# Patient Record
Sex: Male | Born: 1945 | Race: White | Hispanic: No | Smoking: Former smoker
Health system: Southern US, Community
[De-identification: ages and names within clinical notes are randomized; demographics above are authoritative.]

## PROBLEM LIST (undated history)

## (undated) DIAGNOSIS — J449 Chronic obstructive pulmonary disease, unspecified: Secondary | ICD-10-CM

## (undated) DIAGNOSIS — F32A Depression, unspecified: Secondary | ICD-10-CM

## (undated) DIAGNOSIS — F419 Anxiety disorder, unspecified: Secondary | ICD-10-CM

## (undated) DIAGNOSIS — I10 Essential (primary) hypertension: Secondary | ICD-10-CM

## (undated) DIAGNOSIS — E119 Type 2 diabetes mellitus without complications: Secondary | ICD-10-CM

## (undated) DIAGNOSIS — F0390 Unspecified dementia without behavioral disturbance: Secondary | ICD-10-CM

## (undated) DIAGNOSIS — F329 Major depressive disorder, single episode, unspecified: Secondary | ICD-10-CM

---

## 2016-10-27 ENCOUNTER — Emergency Department (HOSPITAL_COMMUNITY): Payer: Medicare Other

## 2016-10-27 ENCOUNTER — Encounter (HOSPITAL_COMMUNITY): Payer: Self-pay | Admitting: Emergency Medicine

## 2016-10-27 ENCOUNTER — Observation Stay (HOSPITAL_COMMUNITY)
Admission: EM | Admit: 2016-10-27 | Discharge: 2016-10-28 | Disposition: A | Discharge status: Skilled Nursing Facility | Payer: Medicare Other | Attending: Internal Medicine | Admitting: Internal Medicine

## 2016-10-27 DIAGNOSIS — M419 Scoliosis, unspecified: Secondary | ICD-10-CM | POA: Diagnosis not present

## 2016-10-27 DIAGNOSIS — R262 Difficulty in walking, not elsewhere classified: Secondary | ICD-10-CM | POA: Insufficient documentation

## 2016-10-27 DIAGNOSIS — R404 Transient alteration of awareness: Secondary | ICD-10-CM | POA: Diagnosis present

## 2016-10-27 DIAGNOSIS — M549 Dorsalgia, unspecified: Secondary | ICD-10-CM | POA: Diagnosis not present

## 2016-10-27 DIAGNOSIS — E1165 Type 2 diabetes mellitus with hyperglycemia: Secondary | ICD-10-CM | POA: Insufficient documentation

## 2016-10-27 DIAGNOSIS — I700 Atherosclerosis of aorta: Secondary | ICD-10-CM | POA: Diagnosis not present

## 2016-10-27 DIAGNOSIS — F419 Anxiety disorder, unspecified: Secondary | ICD-10-CM | POA: Insufficient documentation

## 2016-10-27 DIAGNOSIS — G8929 Other chronic pain: Secondary | ICD-10-CM | POA: Insufficient documentation

## 2016-10-27 DIAGNOSIS — W19XXXA Unspecified fall, initial encounter: Secondary | ICD-10-CM | POA: Diagnosis not present

## 2016-10-27 DIAGNOSIS — F319 Bipolar disorder, unspecified: Secondary | ICD-10-CM | POA: Diagnosis not present

## 2016-10-27 DIAGNOSIS — A419 Sepsis, unspecified organism: Secondary | ICD-10-CM | POA: Diagnosis present

## 2016-10-27 DIAGNOSIS — Z794 Long term (current) use of insulin: Secondary | ICD-10-CM | POA: Diagnosis not present

## 2016-10-27 DIAGNOSIS — E872 Acidosis, unspecified: Secondary | ICD-10-CM | POA: Diagnosis present

## 2016-10-27 DIAGNOSIS — E119 Type 2 diabetes mellitus without complications: Secondary | ICD-10-CM

## 2016-10-27 DIAGNOSIS — F0390 Unspecified dementia without behavioral disturbance: Secondary | ICD-10-CM | POA: Insufficient documentation

## 2016-10-27 DIAGNOSIS — J449 Chronic obstructive pulmonary disease, unspecified: Secondary | ICD-10-CM | POA: Diagnosis not present

## 2016-10-27 DIAGNOSIS — I10 Essential (primary) hypertension: Secondary | ICD-10-CM | POA: Diagnosis not present

## 2016-10-27 DIAGNOSIS — Z79899 Other long term (current) drug therapy: Secondary | ICD-10-CM | POA: Diagnosis not present

## 2016-10-27 HISTORY — DX: Unspecified dementia, unspecified severity, without behavioral disturbance, psychotic disturbance, mood disturbance, and anxiety: F03.90

## 2016-10-27 HISTORY — DX: Chronic obstructive pulmonary disease, unspecified: J44.9

## 2016-10-27 HISTORY — DX: Depression, unspecified: F32.A

## 2016-10-27 HISTORY — DX: Essential (primary) hypertension: I10

## 2016-10-27 HISTORY — DX: Major depressive disorder, single episode, unspecified: F32.9

## 2016-10-27 HISTORY — DX: Type 2 diabetes mellitus without complications: E11.9

## 2016-10-27 HISTORY — DX: Anxiety disorder, unspecified: F41.9

## 2016-10-27 LAB — COMPREHENSIVE METABOLIC PANEL
ALBUMIN: 4.3 g/dL (ref 3.5–5.0)
ALT: 24 U/L (ref 17–63)
ANION GAP: 10 (ref 5–15)
AST: 29 U/L (ref 15–41)
Alkaline Phosphatase: 91 U/L (ref 38–126)
BUN: 9 mg/dL (ref 6–20)
CALCIUM: 9.7 mg/dL (ref 8.9–10.3)
CO2: 25 mmol/L (ref 22–32)
Chloride: 103 mmol/L (ref 101–111)
Creatinine, Ser: 0.72 mg/dL (ref 0.61–1.24)
GFR calc non Af Amer: >60 mL/min (ref >60)
Glucose, Bld: 260 mg/dL — ABNORMAL HIGH (ref 65–99)
POTASSIUM: 4.1 mmol/L (ref 3.5–5.1)
Sodium: 138 mmol/L (ref 135–145)
TOTAL PROTEIN: 7 g/dL (ref 6.5–8.1)
Total Bilirubin: 0.7 mg/dL (ref 0.3–1.2)

## 2016-10-27 LAB — BLOOD GAS, ARTERIAL
Acid-base deficit: 1.7 mmol/L (ref 0.0–2.0)
Bicarbonate: 21.8 mmol/L (ref 20.0–28.0)
DRAWN BY: 103701
FIO2: 21
O2 Saturation: 91.9 %
PCO2 ART: 34.4 mmHg (ref 32.0–48.0)
PH ART: 7.417 (ref 7.350–7.450)
Patient temperature: 98.6
pO2, Arterial: 66.5 mmHg — ABNORMAL LOW (ref 83.0–108.0)

## 2016-10-27 LAB — CBC WITH DIFFERENTIAL/PLATELET
BASOS ABS: 0 10*3/uL (ref 0.0–0.1)
BASOS PCT: 0 %
Eosinophils Absolute: 0.1 10*3/uL (ref 0.0–0.7)
Eosinophils Relative: 2 %
HEMATOCRIT: 37.5 % — AB (ref 39.0–52.0)
HEMOGLOBIN: 12.7 g/dL — AB (ref 13.0–17.0)
LYMPHS PCT: 12 %
Lymphs Abs: 0.7 10*3/uL (ref 0.7–4.0)
MCH: 28.7 pg (ref 26.0–34.0)
MCHC: 33.9 g/dL (ref 30.0–36.0)
MCV: 84.7 fL (ref 78.0–100.0)
Monocytes Absolute: 0.4 10*3/uL (ref 0.1–1.0)
Monocytes Relative: 6 %
NEUTROS ABS: 4.6 10*3/uL (ref 1.7–7.7)
NEUTROS PCT: 80 %
Platelets: 92 10*3/uL — ABNORMAL LOW (ref 150–400)
RBC: 4.43 MIL/uL (ref 4.22–5.81)
RDW: 17.1 % — AB (ref 11.5–15.5)
WBC: 5.8 10*3/uL (ref 4.0–10.5)

## 2016-10-27 LAB — URINALYSIS, ROUTINE W REFLEX MICROSCOPIC
BACTERIA UA: NONE SEEN
BILIRUBIN URINE: NEGATIVE
Glucose, UA: >=500 mg/dL — AB
Hgb urine dipstick: NEGATIVE
KETONES UR: NEGATIVE mg/dL
LEUKOCYTES UA: NEGATIVE
NITRITE: NEGATIVE
PH: 7 (ref 5.0–8.0)
PROTEIN: NEGATIVE mg/dL
Specific Gravity, Urine: 1.011 (ref 1.005–1.030)

## 2016-10-27 LAB — I-STAT CG4 LACTIC ACID, ED
LACTIC ACID, VENOUS: 2.06 mmol/L — AB (ref 0.5–1.9)
Lactic Acid, Venous: 1.83 mmol/L (ref 0.5–1.9)

## 2016-10-27 LAB — MRSA PCR SCREENING: MRSA BY PCR: NEGATIVE

## 2016-10-27 LAB — PROCALCITONIN: Procalcitonin: <0.1 ng/mL

## 2016-10-27 LAB — GLUCOSE, CAPILLARY: Glucose-Capillary: 286 mg/dL — ABNORMAL HIGH (ref 65–99)

## 2016-10-27 LAB — TROPONIN I: Troponin I: 0.04 ng/mL (ref <0.03)

## 2016-10-27 MED ORDER — ONDANSETRON HCL 4 MG PO TABS: 4.0000 mg | ORAL_TABLET | Freq: Four times a day (QID) | ORAL | Status: DC | PRN

## 2016-10-27 MED ORDER — PIPERACILLIN-TAZOBACTAM 3.375 G IVPB 30 MIN
3.3750 g | Freq: Once | INTRAVENOUS | Status: AC
  Administered 2016-10-27: 3.375 g via INTRAVENOUS
  Filled 2016-10-27: qty 50

## 2016-10-27 MED ORDER — ACETAMINOPHEN 325 MG PO TABS: 650.0000 mg | ORAL_TABLET | Freq: Four times a day (QID) | ORAL | Status: DC | PRN

## 2016-10-27 MED ORDER — ACETAMINOPHEN 650 MG RE SUPP: 650.0000 mg | Freq: Four times a day (QID) | RECTAL | Status: DC | PRN

## 2016-10-27 MED ORDER — ALPRAZOLAM 1 MG PO TABS
1.0000 mg | ORAL_TABLET | Freq: Every day | ORAL | Status: DC
  Administered 2016-10-27: 1 mg via ORAL
  Filled 2016-10-27: qty 1

## 2016-10-27 MED ORDER — AMLODIPINE BESYLATE 10 MG PO TABS
10.0000 mg | ORAL_TABLET | Freq: Every day | ORAL | Status: DC
Start: 2016-10-28 — End: 2016-10-28
  Administered 2016-10-28: 10 mg via ORAL
  Filled 2016-10-27: qty 1

## 2016-10-27 MED ORDER — PREGABALIN 25 MG PO CAPS
25.0000 mg | ORAL_CAPSULE | Freq: Two times a day (BID) | ORAL | Status: DC
  Administered 2016-10-27 – 2016-10-28 (×2): 25 mg via ORAL
  Filled 2016-10-27 (×2): qty 1

## 2016-10-27 MED ORDER — ALBUTEROL SULFATE (2.5 MG/3ML) 0.083% IN NEBU: 3.0000 mL | INHALATION_SOLUTION | Freq: Four times a day (QID) | RESPIRATORY_TRACT | Status: DC | PRN

## 2016-10-27 MED ORDER — SIMETHICONE 80 MG PO CHEW: 80.0000 mg | CHEWABLE_TABLET | Freq: Four times a day (QID) | ORAL | Status: DC | PRN

## 2016-10-27 MED ORDER — NICOTINE 7 MG/24HR TD PT24
7.0000 mg | MEDICATED_PATCH | Freq: Every day | TRANSDERMAL | Status: DC
  Filled 2016-10-27: qty 1

## 2016-10-27 MED ORDER — SODIUM CHLORIDE 0.9 % IV BOLUS (SEPSIS)
1000.0000 mL | Freq: Once | INTRAVENOUS | Status: AC
  Administered 2016-10-27: 1000 mL via INTRAVENOUS

## 2016-10-27 MED ORDER — MOMETASONE FURO-FORMOTEROL FUM 100-5 MCG/ACT IN AERO
2.0000 | INHALATION_SPRAY | Freq: Two times a day (BID) | RESPIRATORY_TRACT | Status: DC
  Administered 2016-10-27 – 2016-10-28 (×2): 2 via RESPIRATORY_TRACT
  Filled 2016-10-27: qty 8.8

## 2016-10-27 MED ORDER — ALUM & MAG HYDROXIDE-SIMETH 200-200-20 MG/5ML PO SUSP: 30.0000 mL | Freq: Four times a day (QID) | ORAL | Status: DC | PRN

## 2016-10-27 MED ORDER — TIOTROPIUM BROMIDE MONOHYDRATE 18 MCG IN CAPS
18.0000 ug | ORAL_CAPSULE | Freq: Every day | RESPIRATORY_TRACT | Status: DC
  Administered 2016-10-28: 18 ug via RESPIRATORY_TRACT
  Filled 2016-10-27: qty 5

## 2016-10-27 MED ORDER — VANCOMYCIN HCL IN DEXTROSE 1-5 GM/200ML-% IV SOLN
1000.0000 mg | Freq: Once | INTRAVENOUS | Status: AC
  Administered 2016-10-27: 1000 mg via INTRAVENOUS
  Filled 2016-10-27: qty 200

## 2016-10-27 MED ORDER — BENZTROPINE MESYLATE 0.5 MG PO TABS
1.0000 mg | ORAL_TABLET | Freq: Two times a day (BID) | ORAL | Status: DC
  Administered 2016-10-27 – 2016-10-28 (×2): 1 mg via ORAL
  Filled 2016-10-27 (×2): qty 2

## 2016-10-27 MED ORDER — HYDROXYZINE HCL 50 MG PO TABS: 50.0000 mg | ORAL_TABLET | ORAL | Status: DC | PRN

## 2016-10-27 MED ORDER — ALPRAZOLAM 0.5 MG PO TABS
0.5000 mg | ORAL_TABLET | Freq: Every morning | ORAL | Status: DC
  Administered 2016-10-28: 0.5 mg via ORAL
  Filled 2016-10-27: qty 1

## 2016-10-27 MED ORDER — TRAZODONE HCL 50 MG PO TABS
150.0000 mg | ORAL_TABLET | Freq: Every day | ORAL | Status: DC
Start: 2016-10-27 — End: 2016-10-28
  Administered 2016-10-27: 150 mg via ORAL
  Filled 2016-10-27: qty 3

## 2016-10-27 MED ORDER — ONDANSETRON HCL 4 MG/2ML IJ SOLN: 4.0000 mg | Freq: Four times a day (QID) | INTRAMUSCULAR | Status: DC | PRN

## 2016-10-27 MED ORDER — OLANZAPINE 5 MG PO TABS
10.0000 mg | ORAL_TABLET | Freq: Two times a day (BID) | ORAL | Status: DC
  Administered 2016-10-27 – 2016-10-28 (×2): 10 mg via ORAL
  Filled 2016-10-27 (×2): qty 2

## 2016-10-27 MED ORDER — INSULIN ASPART 100 UNIT/ML ~~LOC~~ SOLN
0.0000 [IU] | Freq: Three times a day (TID) | SUBCUTANEOUS | Status: DC
  Administered 2016-10-28 (×2): 7 [IU] via SUBCUTANEOUS

## 2016-10-27 MED ORDER — OXYCODONE HCL 5 MG PO TABS
5.0000 mg | ORAL_TABLET | Freq: Three times a day (TID) | ORAL | Status: DC | PRN
  Administered 2016-10-27 – 2016-10-28 (×2): 5 mg via ORAL
  Filled 2016-10-27 (×3): qty 1

## 2016-10-27 MED ORDER — ENOXAPARIN SODIUM 40 MG/0.4ML ~~LOC~~ SOLN
40.0000 mg | SUBCUTANEOUS | Status: DC
  Administered 2016-10-27: 40 mg via SUBCUTANEOUS
  Filled 2016-10-27: qty 0.4

## 2016-10-27 MED ORDER — SODIUM CHLORIDE 0.9 % IV SOLN
INTRAVENOUS | Status: DC
  Administered 2016-10-28: 04:00:00 via INTRAVENOUS

## 2016-10-27 MED ORDER — METOPROLOL TARTRATE 25 MG PO TABS
25.0000 mg | ORAL_TABLET | Freq: Two times a day (BID) | ORAL | Status: DC
  Administered 2016-10-27 – 2016-10-28 (×2): 25 mg via ORAL
  Filled 2016-10-27 (×2): qty 1

## 2016-10-27 MED ORDER — SODIUM CHLORIDE 0.9 % IV SOLN
1000.0000 mL | INTRAVENOUS | Status: DC
  Administered 2016-10-27 (×2): 1000 mL via INTRAVENOUS

## 2016-10-27 MED ORDER — SODIUM CHLORIDE 0.9 % IV BOLUS (SEPSIS)
500.0000 mL | Freq: Once | INTRAVENOUS | Status: AC
  Administered 2016-10-27: 500 mL via INTRAVENOUS

## 2016-10-27 NOTE — ED Provider Notes (Signed)
[facility] DEPT Provider Note   CSN: [id number] Arrival date & time: 10/27/16  1131     History   Chief Complaint No chief complaint on file.   HPI Andrew Ward is a 71 y.o. male.  HPI  71 year old male came via EMS from [facility] facility for evaluation of generalized weakness, fever and fall. Hx obtain through the facility nurse, Marvell Fuller, who call EMS initially.  Per nurse, pt was last seen normal by her 2 days ago when she was on her shift, she did not work yesterday.  Today pt was found on the floor in his room, with an apparent unwitnessed fall. He was found confused, had fever.  He normally is independent and active.  Pt has chronic back pain, which he is cared for by a pain specialist, pt does c/o low back pain.  No report of cough, urinary sxs or other infectious sxs.  No prior hx of stroke. Nurse did mentioned that pt was placed on a new medication, Glyburide several days ago.   Hx limited due to AMS.    No past medical history on file.  There are no active problems to display for this patient.   No past surgical history on file.     Home Medications    Prior to Admission medications   Not on File    Family History No family history on file.  Social History Social History  Substance Use Topics  . Smoking status: Not on file  . Smokeless tobacco: Not on file  . Alcohol use Not on file     Allergies   Patient has no allergy information on record.   Review of Systems Review of Systems  Unable to perform ROS: Mental status change     Physical Exam Updated Vital Signs BP 129/71   Pulse 79   Temp 100.2 F (37.9 C) (Rectal)   Resp (!) 30   Wt 75.9 kg (167 lb 4 oz)   SpO2 100%   Physical Exam  Constitutional: He appears well-developed and well-nourished. No distress.  HENT:  Head: Normocephalic and atraumatic.  Mouth is dry  Eyes: Pupils are equal, round, and reactive to light. EOM are normal.  Neck:  Neck is supple,  C-collar in place.  No cervical spine point tenderness, crepitus of step off.  Cardiovascular: Normal rate and regular rhythm.   Pulmonary/Chest: Effort normal and breath sounds normal. He has no wheezes. He has no rales.  Abdominal: Soft. He exhibits no distension. There is no tenderness.  Musculoskeletal: He exhibits tenderness (tenderness along lower spine ).  Moving all 4 extremities  Neurological: He has normal strength. No cranial nerve deficit or sensory deficit. GCS eye subscore is 4. GCS verbal subscore is 4. GCS motor subscore is 6.  Poor finger to nose and heel to shin.  No pronator drift.  Gait not tested.  Cranial nerve III-VII are grossly intact.  NO facial droop.  Nursing note and vitals reviewed.    ED Treatments / Results  Labs (all labs ordered are listed, but only abnormal results are displayed) Labs Reviewed  COMPREHENSIVE METABOLIC PANEL - Abnormal; Notable for the following:       Result Value   Glucose, Bld 260 (*)    All other components within normal limits  CBC WITH DIFFERENTIAL/PLATELET - Abnormal; Notable for the following:    Hemoglobin 12.7 (*)    HCT 37.5 (*)    RDW 17.1 (*)    Platelets 92 (*)  All other components within normal limits  TROPONIN I - Abnormal; Notable for the following:    Troponin I 0.04 (*)    All other components within normal limits  I-STAT CG4 LACTIC ACID, ED - Abnormal; Notable for the following:    Lactic Acid, Venous 2.06 (*)    All other components within normal limits  CULTURE, BLOOD (ROUTINE X 2)  CULTURE, BLOOD (ROUTINE X 2)  URINALYSIS, ROUTINE W REFLEX MICROSCOPIC  I-STAT CG4 LACTIC ACID, ED    EKG  EKG Interpretation None     ED ECG REPORT   Date: 10/27/2016  Rate: 81  Rhythm: normal sinus rhythm  QRS Axis: normal  Intervals: normal  ST/T Wave abnormalities: RSR' in V1 or V2, right VCD or RVH  Conduction Disutrbances:none  Narrative Interpretation:   Old EKG Reviewed: unchanged  I have personally  reviewed the EKG tracing and agree with the computerized printout as noted.   Radiology Dg Chest 2 View  Result Date: 10/27/2016 CLINICAL DATA:  Fall.  Fever. EXAM: CHEST  2 VIEW COMPARISON:  August 15, 2016 FINDINGS: There is no edema or consolidation. The heart size and pulmonary vascularity are normal. No adenopathy. There is aortic atherosclerosis. No evident pneumothorax. No bone lesion evident beyond degenerative change in the thoracic spine. IMPRESSION: No edema or consolidation.  There is aortic atherosclerosis. Aortic Atherosclerosis (ICD10-I70.0). Electronically Signed   By: Lowella Grip III M.D.   On: 10/27/2016 12:56   Dg Lumbar Spine Complete  Result Date: 10/27/2016 CLINICAL DATA:  Pain following fall EXAM: LUMBAR SPINE - COMPLETE 4+ VIEW COMPARISON:  None. FINDINGS: Frontal, lateral, spot lumbosacral lateral, and bilateral oblique views were obtained. There are 5 non-rib-bearing lumbar type vertebral bodies. There is mid lumbar levoscoliosis with rotatory component. There is no demonstrable fracture or spondylolisthesis. There is marked disc space narrowing with vacuum phenomenon at L1-2 and L3-4 bilaterally. There is mild to moderate disc space narrowing elsewhere. There is facet osteoarthritic change at all levels bilaterally. There is aortoiliac atherosclerosis. IMPRESSION: Scoliosis. Multilevel arthropathy, most marked at L1-2 and L3-4. No acute fracture or spondylolisthesis. There is aortoiliac atherosclerosis. Aortic Atherosclerosis (ICD10-I70.0). Electronically Signed   By: Lowella Grip III M.D.   On: 10/27/2016 12:58   Ct Head Wo Contrast  Result Date: 10/27/2016 CLINICAL DATA:  Pain following fall EXAM: CT HEAD WITHOUT CONTRAST CT CERVICAL SPINE WITHOUT CONTRAST TECHNIQUE: Multidetector CT imaging of the head and cervical spine was performed following the standard protocol without intravenous contrast. Multiplanar CT image reconstructions of the cervical spine were also  generated. COMPARISON:  Head CT August 18, 2016 ; cervical MRI March 15, 2016 FINDINGS: CT HEAD FINDINGS Brain: Mild to moderate diffuse atrophy is stable. There is no intracranial mass, hemorrhage, extra-axial fluid collection, or midline shift. There is slight small vessel disease in the centra semiovale bilaterally. Elsewhere gray-white compartments appear normal. No acute infarct evident. Vascular: There is no appreciable hyperdense vessel. Calcification is noted in each carotid siphon and distal internal carotid artery region. Skull: Bony calvarium appears intact. Sinuses/Orbits: There is mucosal thickening in several ethmoid air cells bilaterally. Other paranasal sinuses are clear. Orbits appear symmetric bilaterally. Other: There is opacification in several mastoid air cells on the right, stable. Mastoid air cells on the left are clear. CT CERVICAL SPINE FINDINGS Alignment: There is mid cervical dextroscoliosis. There is 2 mm of retrolisthesis of C6 on C7. There is 3 mm of anterolisthesis of C7 on T1. Skull base and vertebrae: There is  moderate pannus posterior to the odontoid, not causing appreciable impression on the craniocervical junction. Skull base and craniocervical junction regions otherwise appear unremarkable. There is bony remodeling at the superior aspect of C6 and inferior aspect of C5, stable from prior MRI, likely due to residual from old trauma. There is no acute fracture. There are no blastic or lytic bone lesions. Bones appear osteoporotic. Soft tissues and spinal canal: The prevertebral soft tissues and predental space regions are normal. The disc spaces appear unremarkable. Disc levels: There is ankylosis with remodeling at C5-6. There is marked disc space narrowing at C6-7 and C7-T1. There is multilevel facet osteoarthritic change. There is exit foraminal narrowing due to bony hypertrophy at C3-4 on the right, at C5-6 bilaterally, and at C6-7 on the right. There is no frank nerve root  edema or effacement. No disc extrusion or stenosis evident. Upper chest: Visualized lung apices are clear. Other: There is carotid artery calcification bilaterally. IMPRESSION: CT head: Atrophy with slight periventricular small vessel disease. No intracranial mass, hemorrhage, or extra-axial fluid collection. No evident acute infarct. There are foci of arterial vascular calcification. Mild mucosal thickening noted in several ethmoid air cells. 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 CT cervical spine: 1. Residua of old trauma at C5-6 with remodeling of the C5 and C6 vertebral bodies. Acute fracture. 2. Areas of mild spondylolisthesis at C6-7 and C7-T1, felt to be due to underlying spondylosis. 3. Multilevel arthropathy. No frank disc extrusion or high-grade stenosis. 4.  Bones osteoporotic. 5.  Bilateral carotid artery calcification. Electronically Signed   By: Lowella Grip III M.D.   On: 10/27/2016 13:33   Ct Cervical Spine Wo Contrast  Result Date: 10/27/2016 CLINICAL DATA:  Pain following fall EXAM: CT HEAD WITHOUT CONTRAST CT CERVICAL SPINE WITHOUT CONTRAST TECHNIQUE: Multidetector CT imaging of the head and cervical spine was performed following the standard protocol without intravenous contrast. Multiplanar CT image reconstructions of the cervical spine were also generated. COMPARISON:  Head CT August 18, 2016 ; cervical MRI March 15, 2016 FINDINGS: CT HEAD FINDINGS Brain: Mild to moderate diffuse atrophy is stable. There is no intracranial mass, hemorrhage, extra-axial fluid collection, or midline shift. There is slight small vessel disease in the centra semiovale bilaterally. Elsewhere gray-white compartments appear normal. No acute infarct evident. Vascular: There is no appreciable hyperdense vessel. Calcification is noted in each carotid siphon and distal internal carotid artery region. Skull: Bony calvarium appears intact. Sinuses/Orbits: There is mucosal thickening in several ethmoid  air cells bilaterally. Other paranasal sinuses are clear. Orbits appear symmetric bilaterally. Other: There is opacification in several mastoid air cells on the right, stable. Mastoid air cells on the left are clear. CT CERVICAL SPINE FINDINGS Alignment: There is mid cervical dextroscoliosis. There is 2 mm of retrolisthesis of C6 on C7. There is 3 mm of anterolisthesis of C7 on T1. Skull base and vertebrae: There is moderate pannus posterior to the odontoid, not causing appreciable impression on the craniocervical junction. Skull base and craniocervical junction regions otherwise appear unremarkable. There is bony remodeling at the superior aspect of C6 and inferior aspect of C5, stable from prior MRI, likely due to residual from old trauma. There is no acute fracture. There are no blastic or lytic bone lesions. Bones appear osteoporotic. Soft tissues and spinal canal: The prevertebral soft tissues and predental space regions are normal. The disc spaces appear unremarkable. Disc levels: There is ankylosis with remodeling at C5-6. There is marked disc space narrowing at C6-7 and  C7-T1. There is multilevel facet osteoarthritic change. There is exit foraminal narrowing due to bony hypertrophy at C3-4 on the right, at C5-6 bilaterally, and at C6-7 on the right. There is no frank nerve root edema or effacement. No disc extrusion or stenosis evident. Upper chest: Visualized lung apices are clear. Other: There is carotid artery calcification bilaterally. IMPRESSION: CT head: Atrophy with slight periventricular small vessel disease. No intracranial mass, hemorrhage, or extra-axial fluid collection. No evident acute infarct. There are foci of arterial vascular calcification. Mild mucosal thickening noted in several ethmoid air cells. 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 CT cervical spine: 1. Residua of old trauma at C5-6 with remodeling of the C5 and C6 vertebral bodies. Acute fracture. 2. Areas of mild  spondylolisthesis at C6-7 and C7-T1, felt to be due to underlying spondylosis. 3. Multilevel arthropathy. No frank disc extrusion or high-grade stenosis. 4.  Bones osteoporotic. 5.  Bilateral carotid artery calcification. Electronically Signed   By: Lowella Grip III M.D.   On: 10/27/2016 13:33    Procedures Procedures (including critical care time)  Medications Ordered in ED Medications - No data to display   Initial Impression / Assessment and Plan / ED Course  I have reviewed the triage vital signs and the nursing notes.  Pertinent labs & imaging results that were available during my care of the patient were reviewed by me and considered in my medical decision making (see chart for details).     BP 129/71   Pulse 79   Temp 100.2 F (37.9 C) (Rectal)   Resp (!) 30   Wt 75.9 kg (167 lb 4 oz)   SpO2 100%    Final Clinical Impressions(s) / ED Diagnoses   Final diagnoses:  Altered level of consciousness  Sepsis, due to unspecified organism [facility])    New Prescriptions New Prescriptions   No medications on file   12:33 PM Pt here for generalized weakness, fever, unwitnessed fall. Work up initiated.  Care discussed with Dr. Jeneen Rinks  1:26 PM Elevated lactic acid of 2.06, code sepsis initiated.  Pt given fluid resuscitation at 16ml/kg and broad spectrum abx of vancomycin/zosyn.    3:15 PM Elevated lactic acid of 2.6. Electrolytes significant for hyperglycemia with glucose of 260 but normal anion gap. Normal WBC, hemoglobin is 12.7. Troponin is mildly elevated at 0.04 without EKG changes. Perhaps demand ischemia.  No active CP. Head and cervical spine CT obtained show no acute fracture. Chest x-ray without evidence of infection, lumbar spine x-ray without acute fractures or dislocation.  No skin lesion or sign of skin infection on exam.    3:25 PM Appreciate consultation with [facility] Dr. [place] Pink who agrees to see and admit pt for further evaluation of his condition.   Pt may benefit from a brain MRI for stroke work up due to [place].  Aside from poor coordination, no other focal neuro deficit appreciate on exam.     CRITICAL CARE Performed by: Jakavion Bilodeau Total critical care time: 35 minutes Critical care time was exclusive of separately billable procedures and treating other patients. Critical care was necessary to treat or prevent imminent or life-threatening deterioration. Critical care was time spent personally by me on the following activities: development of treatment plan with patient and/or surrogate as well as nursing, discussions with consultants, evaluation of patient's response to treatment, examination of patient, obtaining history from patient or surrogate, ordering and performing treatments and interventions, ordering and review of laboratory studies, ordering and review of radiographic  studies, pulse oximetry and re-evaluation of patient's condition.    Domenic Moras, PA-C 10/27/16 1528    Tanna Furry, MD 11/06/16 [phone]

## 2016-10-27 NOTE — ED Notes (Signed)
Lab will be processing procalcitonin as an add on.

## 2016-10-27 NOTE — Progress Notes (Signed)
A consult was received from an ED physician for Zosyn per pharmacy dosing.  The patient's profile has been reviewed for ht/wt/allergies/indication/available labs.   A one time order has been placed for Zosyn 3.375gm IV x1 over 30 min.  Further antibiotics/pharmacy consults should be ordered by admitting physician if indicated.                       Thank you, Netta Cedars, PharmD, BCPS 10/27/2016@1 :31 PM

## 2016-10-27 NOTE — ED Triage Notes (Signed)
Pt had fall at 10:20, from NH. NH also reports recent fever. Pt c/o lower back pain, ems reports patient with chronic lower back pain.

## 2016-10-27 NOTE — ED Notes (Signed)
Patient transported to CT 

## 2016-10-27 NOTE — ED Notes (Signed)
Hinton Dyer [phone] accepting RN. Report [id number]

## 2016-10-27 NOTE — H&P (Signed)
History and Physical  Andrew Ward ERD:[id number] DOB: January 27, 1946 DOA: 10/27/2016  Referring physician: Domenic Moras, ER PA   PCP: [name], DO  Outpatient Specialists: None Patient coming from: skilled nursing & is able to ambulate with a walker  Chief Complaint: found down  HPI: Andrew Ward is a 71 y.o. male with medical history significant for  dementia, bipolar disorder, COPD and diabetes mellitus who resides in a skilled nursing facility. Patient was found down on the ground in his room. It was unclear if he passed out or if he had mechanical fall. It was unclear how long that he bit down, possibly for several hours or most of the day. He is brought into the emergency room. Patient not a good historian although he says that he fell but does not remember this. He says he is not sure what happened.   ED Course: emergency room, CT scan of the head and neck were unremarkable. No signs of any fractures or head bleed. Lab work was noteworthy for family elevated troponin 0.04, lactic acid level 2.06 and normal white count and hemoglobin. Rest of labs are unremarkable. To calcitonin level at less than 0.10. Chest x-ray and urinalysis normal. Patient given a dose of IV Zosyn and vancomycin after blood cultures were done. Hospitalist will call for further evaluation.  Review of Systems: Patient seen in the emergency room . Pt complains of some weakness, but nothing focal.   Pt denies any headaches, vision changes, dysphagia, chest pain, palpitations, shortness of breath, wheeze, cough, abdominal pain, hematuria, dysuria, constipation, diarrhea, focal extremity numbness weakness or pain .  Review of systems are otherwise negative   Past Medical History:  Diagnosis Date  . Anxiety   . COPD (chronic obstructive pulmonary disease) ([place])   . Dementia   . Depression   . Diabetes mellitus without complication ([place])   . Hypertension    History reviewed. No pertinent surgical  history.  Social History:  reports that he has quit smokingAlthough admits to smoking one to 2 cigarettes a week.. He has never used smokeless tobacco. He reports that he does not drink alcohol or use drugs.resides in a skilled nursing facility. Ambulates with a walker    No Known Allergies  Family history: When asked what medical problems run in his family, he says he cannot remember and does not think that any medical problems are in his family  Prior to Admission medications   Medication Sig Start Date End Date Taking? Authorizing Provider  acetaminophen (TYLENOL) 325 MG tablet Take 650 mg by mouth every 6 (six) hours as needed.   Yes [provider]  albuterol (PROVENTIL HFA;VENTOLIN HFA) 108 (90 Base) MCG/ACT inhaler Inhale 2 puffs into the lungs every 6 (six) hours as needed for wheezing or shortness of breath.   Yes [provider]  ALPRAZolam Duanne Moron) 0.5 MG tablet Take 0.5 mg by mouth every morning.   Yes [provider]  ALPRAZolam Duanne Moron) 1 MG tablet Take 1 mg by mouth at bedtime.   Yes [provider]  alum & mag hydroxide-simeth (MAALOX PLUS) 400-400-40 MG/5ML suspension Take 30 mLs by mouth 4 (four) times daily as needed for indigestion.   Yes [provider]  amLODipine (NORVASC) 10 MG tablet Take 10 mg by mouth daily.   Yes [provider]  benztropine (COGENTIN) 1 MG tablet Take 1 mg by mouth 2 (two) times daily.   Yes [provider]  bisacodyl (DULCOLAX) 5 MG EC tablet Take 10  mg by mouth daily as needed for moderate constipation.   Yes [provider]  Fluticasone-Salmeterol (ADVAIR) 100-50 MCG/DOSE AEPB Inhale 1 puff into the lungs 2 (two) times daily.   Yes [provider]  glipiZIDE (GLUCOTROL) 5 MG tablet Take by mouth daily before breakfast.   Yes [provider]  hydrOXYzine (ATARAX/VISTARIL) 50 MG tablet Take 50 mg by mouth every 4 (four) hours as needed for anxiety.   Yes  [provider]  insulin aspart (NOVOLOG) 100 UNIT/ML injection Inject 2-6 Units into the skin 3 (three) times daily before meals.   Yes [provider]  metFORMIN (GLUMETZA) 1000 MG (MOD) 24 hr tablet Take 1,000 mg by mouth 2 (two) times daily with a meal.   Yes [provider]  metoprolol tartrate (LOPRESSOR) 50 MG tablet Take 25 mg by mouth 2 (two) times daily.   Yes [provider]  nicotine (NICODERM CQ - DOSED IN MG/24 HR) 7 mg/24hr patch Place 7 mg onto the skin daily.   Yes [provider]  OLANZapine (ZYPREXA) 10 MG tablet Take 10 mg by mouth 2 (two) times daily.   Yes [provider]  oxycodone (OXY-IR) 5 MG capsule Take 5 mg by mouth every 8 (eight) hours as needed.   Yes [provider]  pregabalin (LYRICA) 25 MG capsule Take 25 mg by mouth 2 (two) times daily.   Yes [provider]  simethicone (MYLICON) 80 MG chewable tablet Chew 80 mg by mouth every 6 (six) hours as needed for flatulence.   Yes [provider]  tiotropium (SPIRIVA) 18 MCG inhalation capsule Place 18 mcg into inhaler and inhale daily.   Yes [provider]  traZODone (DESYREL) 150 MG tablet Take 150 mg by mouth at bedtime.   Yes [provider]    Physical Exam: BP 137/64 (BP Location: Right Arm)   Pulse 77   Temp 98.4 F (36.9 C) (Oral)   Resp 18   Ht 5\' 10"  (1.778 m)   Wt 75.9 kg (167 lb 5.3 oz)   SpO2 99%   BMI 24.01 kg/m   General:  alert and oriented 3, no acute distress  Eyes: sclera nonicteric, extraocular movements are intact  ENT: normocephalic, mucous remains are slightly dry  Neck:   supple, no JVD  Cardiovascular: Regular rate and rhythm, S1-S2   Respiratory: Clear to auscultation bilaterally, decreased breath sounds throughout   Abdomen: Soft, nontender, nondistended, positive bowel sounds   Skin: no skin breaks, tears or lesions  Musculoskeletal: no clubbing or cyanosis or edema    Psychiatric: Patient has some underlying dementia. However no evidence of acute psychoses he is able to answer questions appropriately   Neurologic: No focal deficits            Labs on Admission:  Basic Metabolic Panel:  Recent Labs Lab 10/27/16 1252  NA 138  K 4.1  CL 103  CO2 25  GLUCOSE 260*  BUN 9  CREATININE 0.72  CALCIUM 9.7   Liver Function Tests:  Recent Labs Lab 10/27/16 1252  AST 29  ALT 24  ALKPHOS 91  BILITOT 0.7  PROT 7.0  ALBUMIN 4.3   No results for input(s): LIPASE, AMYLASE in the last 168 hours. No results for input(s): AMMONIA in the last 168 hours. CBC:  Recent Labs Lab 10/27/16 1252  WBC 5.8  NEUTROABS 4.6  HGB 12.7*  HCT 37.5*  MCV 84.7  PLT 92*   Cardiac Enzymes:  Recent  Labs Lab 10/27/16 1252  TROPONINI 0.04*    BNP (last 3 results) No results for input(s): BNP in the last 8760 hours.  ProBNP (last 3 results) No results for input(s): PROBNP in the last 8760 hours.  CBG: No results for input(s): GLUCAP in the last 168 hours.  Radiological Exams on Admission: Dg Chest 2 View  Result Date: 10/27/2016 CLINICAL DATA:  Fall.  Fever. EXAM: CHEST  2 VIEW COMPARISON:  August 15, 2016 FINDINGS: There is no edema or consolidation. The heart size and pulmonary vascularity are normal. No adenopathy. There is aortic atherosclerosis. No evident pneumothorax. No bone lesion evident beyond degenerative change in the thoracic spine. IMPRESSION: No edema or consolidation.  There is aortic atherosclerosis. Aortic Atherosclerosis (ICD10-I70.0). Electronically Signed   By: Lowella Grip III M.D.   On: 10/27/2016 12:56   Dg Lumbar Spine Complete  Result Date: 10/27/2016 CLINICAL DATA:  Pain following fall EXAM: LUMBAR SPINE - COMPLETE 4+ VIEW COMPARISON:  None. FINDINGS: Frontal, lateral, spot lumbosacral lateral, and bilateral oblique views were obtained. There are 5 non-rib-bearing lumbar type vertebral bodies. There is mid lumbar  levoscoliosis with rotatory component. There is no demonstrable fracture or spondylolisthesis. There is marked disc space narrowing with vacuum phenomenon at L1-2 and L3-4 bilaterally. There is mild to moderate disc space narrowing elsewhere. There is facet osteoarthritic change at all levels bilaterally. There is aortoiliac atherosclerosis. IMPRESSION: Scoliosis. Multilevel arthropathy, most marked at L1-2 and L3-4. No acute fracture or spondylolisthesis. There is aortoiliac atherosclerosis. Aortic Atherosclerosis (ICD10-I70.0). Electronically Signed   By: Lowella Grip III M.D.   On: 10/27/2016 12:58   Ct Head Wo Contrast  Result Date: 10/27/2016 CLINICAL DATA:  Pain following fall EXAM: CT HEAD WITHOUT CONTRAST CT CERVICAL SPINE WITHOUT CONTRAST TECHNIQUE: Multidetector CT imaging of the head and cervical spine was performed following the standard protocol without intravenous contrast. Multiplanar CT image reconstructions of the cervical spine were also generated. COMPARISON:  Head CT August 18, 2016 ; cervical MRI March 15, 2016 FINDINGS: CT HEAD FINDINGS Brain: Mild to moderate diffuse atrophy is stable. There is no intracranial mass, hemorrhage, extra-axial fluid collection, or midline shift. There is slight small vessel disease in the centra semiovale bilaterally. Elsewhere gray-white compartments appear normal. No acute infarct evident. Vascular: There is no appreciable hyperdense vessel. Calcification is noted in each carotid siphon and distal internal carotid artery region. Skull: Bony calvarium appears intact. Sinuses/Orbits: There is mucosal thickening in several ethmoid air cells bilaterally. Other paranasal sinuses are clear. Orbits appear symmetric bilaterally. Other: There is opacification in several mastoid air cells on the right, stable. Mastoid air cells on the left are clear. CT CERVICAL SPINE FINDINGS Alignment: There is mid cervical dextroscoliosis. There is 2 mm of retrolisthesis of C6  on C7. There is 3 mm of anterolisthesis of C7 on T1. Skull base and vertebrae: There is moderate pannus posterior to the odontoid, not causing appreciable impression on the craniocervical junction. Skull base and craniocervical junction regions otherwise appear unremarkable. There is bony remodeling at the superior aspect of C6 and inferior aspect of C5, stable from prior MRI, likely due to residual from old trauma. There is no acute fracture. There are no blastic or lytic bone lesions. Bones appear osteoporotic. Soft tissues and spinal canal: The prevertebral soft tissues and predental space regions are normal. The disc spaces appear unremarkable. Disc levels: There is ankylosis with remodeling at C5-6. There is marked disc space narrowing at C6-7 and C7-T1. There is  multilevel facet osteoarthritic change. There is exit foraminal narrowing due to bony hypertrophy at C3-4 on the right, at C5-6 bilaterally, and at C6-7 on the right. There is no frank nerve root edema or effacement. No disc extrusion or stenosis evident. Upper chest: Visualized lung apices are clear. Other: There is carotid artery calcification bilaterally. IMPRESSION: CT head: Atrophy with slight periventricular small vessel disease. No intracranial mass, hemorrhage, or extra-axial fluid collection. No evident acute infarct. There are foci of arterial vascular calcification. Mild mucosal thickening noted in several ethmoid air cells. 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 CT cervical spine: 1. Residua of old trauma at C5-6 with remodeling of the C5 and C6 vertebral bodies. Acute fracture. 2. Areas of mild spondylolisthesis at C6-7 and C7-T1, felt to be due to underlying spondylosis. 3. Multilevel arthropathy. No frank disc extrusion or high-grade stenosis. 4.  Bones osteoporotic. 5.  Bilateral carotid artery calcification. Electronically Signed   By: Lowella Grip III M.D.   On: 10/27/2016 13:33   Ct Cervical Spine Wo  Contrast  Result Date: 10/27/2016 CLINICAL DATA:  Pain following fall EXAM: CT HEAD WITHOUT CONTRAST CT CERVICAL SPINE WITHOUT CONTRAST TECHNIQUE: Multidetector CT imaging of the head and cervical spine was performed following the standard protocol without intravenous contrast. Multiplanar CT image reconstructions of the cervical spine were also generated. COMPARISON:  Head CT August 18, 2016 ; cervical MRI March 15, 2016 FINDINGS: CT HEAD FINDINGS Brain: Mild to moderate diffuse atrophy is stable. There is no intracranial mass, hemorrhage, extra-axial fluid collection, or midline shift. There is slight small vessel disease in the centra semiovale bilaterally. Elsewhere gray-white compartments appear normal. No acute infarct evident. Vascular: There is no appreciable hyperdense vessel. Calcification is noted in each carotid siphon and distal internal carotid artery region. Skull: Bony calvarium appears intact. Sinuses/Orbits: There is mucosal thickening in several ethmoid air cells bilaterally. Other paranasal sinuses are clear. Orbits appear symmetric bilaterally. Other: There is opacification in several mastoid air cells on the right, stable. Mastoid air cells on the left are clear. CT CERVICAL SPINE FINDINGS Alignment: There is mid cervical dextroscoliosis. There is 2 mm of retrolisthesis of C6 on C7. There is 3 mm of anterolisthesis of C7 on T1. Skull base and vertebrae: There is moderate pannus posterior to the odontoid, not causing appreciable impression on the craniocervical junction. Skull base and craniocervical junction regions otherwise appear unremarkable. There is bony remodeling at the superior aspect of C6 and inferior aspect of C5, stable from prior MRI, likely due to residual from old trauma. There is no acute fracture. There are no blastic or lytic bone lesions. Bones appear osteoporotic. Soft tissues and spinal canal: The prevertebral soft tissues and predental space regions are normal. The disc  spaces appear unremarkable. Disc levels: There is ankylosis with remodeling at C5-6. There is marked disc space narrowing at C6-7 and C7-T1. There is multilevel facet osteoarthritic change. There is exit foraminal narrowing due to bony hypertrophy at C3-4 on the right, at C5-6 bilaterally, and at C6-7 on the right. There is no frank nerve root edema or effacement. No disc extrusion or stenosis evident. Upper chest: Visualized lung apices are clear. Other: There is carotid artery calcification bilaterally. IMPRESSION: CT head: Atrophy with slight periventricular small vessel disease. No intracranial mass, hemorrhage, or extra-axial fluid collection. No evident acute infarct. There are foci of arterial vascular calcification. Mild mucosal thickening noted in several ethmoid air cells. 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 CT cervical  spine: 1. Residua of old trauma at C5-6 with remodeling of the C5 and C6 vertebral bodies. Acute fracture. 2. Areas of mild spondylolisthesis at C6-7 and C7-T1, felt to be due to underlying spondylosis. 3. Multilevel arthropathy. No frank disc extrusion or high-grade stenosis. 4.  Bones osteoporotic. 5.  Bilateral carotid artery calcification. Electronically Signed   By: Lowella Grip III M.D.   On: 10/27/2016 13:33    EKG: Independently reviewed.   normal sinus rhythm ?R'SR DV 1. Previously seen on EKG   Assessment/Plan Present on Admission: . Lactic acidosis: No evidence of acute infection. I suspect that patient developed lactic acidosis secondary to metformin. Would recommend discontinuation of metformin, hydrate and follow lactic acid levels. Repeat lactic acid level RDW 1.8 . Dementia without behavioral disturbance/history of bipolar disorder: Continue home psychiatric medications. Stable. Marland Kitchen COPD (chronic obstructive pulmonary disease) ([place]): Stable. Patient's breathing appears at baseline, oxygen saturation greater than 90% on room air . Hypertension:  Blood pressure stable, continue home medications . Diabetes mellitus without complication: Sliding scale while here in the hospital. Likely patient will have to come off of metformin. See above  Active Problems:   Lactic acidosis   Dementia   Diabetes mellitus without complication ([facility])   COPD (chronic obstructive pulmonary disease) ([facility])   Hypertension   Bipolar disorder ([facility])   DVT prophylaxis: Lovenox   Code Status: Presumed full code, trying to get records from skilled nursing   Family Communication:  Left message for son  Disposition Plan: Return back to skilled nursing tomorrow lactic acid level normal   Consults called: None   Admission status: Given potential for discharge tomorrow, place in observation     Annita Brod MD [facility] Pager [phone]  If 7PM-7AM, please contact night-coverage [email or web address] Password [email or web address]  10/27/2016, 7:10 PM

## 2016-10-27 NOTE — ED Notes (Signed)
Bed: WA09 Expected date:  Expected time:  Means of arrival:  Comments: EMS weak/fever
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
## 2016-10-28 DIAGNOSIS — F0390 Unspecified dementia without behavioral disturbance: Secondary | ICD-10-CM | POA: Diagnosis not present

## 2016-10-28 DIAGNOSIS — W19XXXA Unspecified fall, initial encounter: Secondary | ICD-10-CM | POA: Diagnosis not present

## 2016-10-28 DIAGNOSIS — E872 Acidosis: Secondary | ICD-10-CM | POA: Diagnosis not present

## 2016-10-28 DIAGNOSIS — J449 Chronic obstructive pulmonary disease, unspecified: Secondary | ICD-10-CM | POA: Diagnosis not present

## 2016-10-28 DIAGNOSIS — F319 Bipolar disorder, unspecified: Secondary | ICD-10-CM | POA: Diagnosis not present

## 2016-10-28 LAB — BASIC METABOLIC PANEL
ANION GAP: 5 (ref 5–15)
BUN: 7 mg/dL (ref 6–20)
CHLORIDE: 109 mmol/L (ref 101–111)
CO2: 25 mmol/L (ref 22–32)
Calcium: 8.7 mg/dL — ABNORMAL LOW (ref 8.9–10.3)
Creatinine, Ser: 0.65 mg/dL (ref 0.61–1.24)
GFR calc Af Amer: >60 mL/min (ref >60)
GFR calc non Af Amer: >60 mL/min (ref >60)
GLUCOSE: 281 mg/dL — AB (ref 65–99)
POTASSIUM: 3.7 mmol/L (ref 3.5–5.1)
Sodium: 139 mmol/L (ref 135–145)

## 2016-10-28 LAB — CBC
HEMATOCRIT: 32 % — AB (ref 39.0–52.0)
HEMOGLOBIN: 10.9 g/dL — AB (ref 13.0–17.0)
MCH: 29.1 pg (ref 26.0–34.0)
MCHC: 34.1 g/dL (ref 30.0–36.0)
MCV: 85.6 fL (ref 78.0–100.0)
Platelets: 84 10*3/uL — ABNORMAL LOW (ref 150–400)
RBC: 3.74 MIL/uL — ABNORMAL LOW (ref 4.22–5.81)
RDW: 17.3 % — ABNORMAL HIGH (ref 11.5–15.5)
WBC: 4.4 10*3/uL (ref 4.0–10.5)

## 2016-10-28 LAB — GLUCOSE, CAPILLARY
GLUCOSE-CAPILLARY: 344 mg/dL — AB (ref 65–99)
Glucose-Capillary: 329 mg/dL — ABNORMAL HIGH (ref 65–99)

## 2016-10-28 LAB — LACTIC ACID, PLASMA: Lactic Acid, Venous: 1 mmol/L (ref 0.5–1.9)

## 2016-10-28 NOTE — Progress Notes (Signed)
Tried twice to call report to [facility].  Unable to get anyone, first a recording, then no answer.  Sticky note will be placed on packet with my number so receiving RN can contact me for report.

## 2016-10-28 NOTE — Progress Notes (Signed)
Report received from Linwood Dibbles, RN. No change from the initial pm assessment. Will continue to monitor and follow the POC.

## 2016-10-28 NOTE — Evaluation (Signed)
Physical Therapy Evaluation Patient Details Name: Andrew Ward MRN: [id number] DOB: 1945/08/21 Today's Date: 10/28/2016   History of Present Illness  71 yo male admitted from SNF with lactic acidosis, fever, weakness. Pt sustained a fall at facility. Hx of chronic back pain, neuropathy, bipolar d/o, COPD, DM    Clinical Impression  On eval, pt required Min assist for mobility. He walked ~50 feet while holding on to the IV pole. Pt presents with general weakness, decreased activity tolerance, and impaired gait and balance. Pt c/o 9/10 pain in his back and in both feet. Discussed d/c plan-pt plans to return to [place]. Recommend return to SNF.     Follow Up Recommendations SNF    Equipment Recommendations  None recommended by PT    Recommendations for Other Services       Precautions / Restrictions Precautions Precautions: Fall Restrictions Weight Bearing Restrictions: No      Mobility  Bed Mobility Overal bed mobility: Needs Assistance Bed Mobility: Supine to Sit     Supine to sit: Supervision;HOB elevated     General bed mobility comments: for safety  Transfers Overall transfer level: Needs assistance   Transfers: Sit to/from Stand Sit to Stand: Min assist         General transfer comment: Assist to rise, stabilize, control descent. VCs safety, hand placement.   Ambulation/Gait Ambulation/Gait assistance: Min assist Ambulation Distance (Feet): 50 Feet Assistive device:  (IV pole) Gait Pattern/deviations: Step-through pattern;Decreased stride length;Staggering left;Staggering right;Drifts right/left     General Gait Details: Flexed trunk posture. Pt relied on IV pole with both hands. He c/o back and feet pain. Distance limited by pain.   Stairs            Wheelchair Mobility    Modified Rankin (Stroke Patients Only)       Balance Overall balance assessment: Needs assistance;History of Falls           Standing balance-Leahy Scale: Poor                                Pertinent Vitals/Pain Pain Assessment: 0-10 Pain Score: 9  Pain Location: back, bil feet Pain Descriptors / Indicators: Aching;Sore Pain Intervention(s): RN gave pain meds during session    Home Living Family/patient expects to be discharged to:: Skilled nursing facility                      Prior Function Level of Independence: Needs assistance   Gait / Transfers Assistance Needed: uses cane for ambulation           Hand Dominance        Extremity/Trunk Assessment   Upper Extremity Assessment Upper Extremity Assessment: Generalized weakness    Lower Extremity Assessment Lower Extremity Assessment: Generalized weakness    Cervical / Trunk Assessment Cervical / Trunk Assessment: Normal  Communication   Communication: No difficulties  Cognition Arousal/Alertness: Awake/alert Behavior During Therapy: WFL for tasks assessed/performed Overall Cognitive Status: Within Functional Limits for tasks assessed                                        General Comments      Exercises     Assessment/Plan    PT Assessment Patient needs continued PT services  PT Problem List Decreased strength;Decreased mobility;Decreased activity tolerance;Decreased balance;Decreased knowledge  of use of DME;Pain       PT Treatment Interventions DME instruction;Gait training;Therapeutic activities;Therapeutic exercise;Patient/family education;Functional mobility training;Balance training    PT Goals (Current goals can be found in the Care Plan section)  Acute Rehab PT Goals Patient Stated Goal: less pain. return to [facility] PT Goal Formulation: With patient Time For Goal Achievement: 11/11/16 Potential to Achieve Goals: Good    Frequency Min 2X/week   Barriers to discharge        Co-evaluation               AM-PAC PT "6 Clicks" Daily Activity  Outcome Measure Difficulty turning over in bed (including  adjusting bedclothes, sheets and blankets)?: A Little Difficulty moving from lying on back to sitting on the side of the bed? : A Little Difficulty sitting down on and standing up from a chair with arms (e.g., wheelchair, bedside commode, etc,.)?: Unable Help needed moving to and from a bed to chair (including a wheelchair)?: A Little Help needed walking in hospital room?: A Little Help needed climbing 3-5 steps with a railing? : A Lot 6 Click Score: 15    End of Session Equipment Utilized During Treatment: Gait belt Activity Tolerance: Patient limited by pain;Patient limited by fatigue Patient left: in bed;with call bell/phone within reach;with nursing/sitter in room   PT Visit Diagnosis: Muscle weakness (generalized) (M62.81);Difficulty in walking, not elsewhere classified (R26.2)    Time: 7824-2353 PT Time Calculation (min) (ACUTE ONLY): 8 min   Charges:   PT Evaluation $PT Eval Low Complexity: 1 Low     PT G Codes:   PT G-Codes **NOT FOR INPATIENT CLASS** Functional Assessment Tool Used: AM-PAC 6 Clicks Basic Mobility;Clinical judgement Functional Limitation: Mobility: Walking and moving around Mobility: Walking and Moving Around Current Status (I1443): At least 20 percent but less than 40 percent impaired, limited or restricted Mobility: Walking and Moving Around Goal Status [id number]): At least 1 percent but less than 20 percent impaired, limited or restricted      Weston Anna, MPT Pager: [phone]

## 2016-10-28 NOTE — Progress Notes (Signed)
Pt from [facility]- returning there at [facility] today. Room [phone]- Report [phone] Spoke with facility- no FL2 needed as pt was admitted under observation less than 24 hours.  Spoke with pt re: transport back- pt needs PTAR due to recent fall and weakness. Discussed there may be cost associated with PTAR transport- pt agrees. CSW completed medical necessity form and arranged transport.   Pt has been at [facility] approximately 1 month- completed rehab and now [facility] resident. He states he is not close with family and asks CSW not contact them.   SS# not in hospital file- Pt reports it is: [phone].  Sharren Bridge, MSW, LCSW Clinical Social Work 10/28/2016 [phone]

## 2016-10-28 NOTE — Discharge Summary (Signed)
Physician Discharge Summary  Andrew Ward HBZ:[id number] DOB: 1945-08-24 DOA: 10/27/2016  PCP: [name], DO  Admit date: 10/27/2016 Discharge date: 10/28/2016  Admitted From: SNF Disposition:  SNF  Recommendations for Outpatient Follow-up:  1. Follow up with PCP in 1 week 2. Please check blood sugar daily with meals and bedtime  3. Please follow up on the following pending results: final blood culture result   Discharge Condition: Stable CODE STATUS: Full  Diet recommendation: Heart healthy   Brief/Interim Summary: From H&P by Dr. [place] Pink: Andrew Ward is a 71 y.o. male with medical history significant for  dementia, bipolar disorder, COPD and diabetes mellitus who resides in a skilled nursing facility. Patient was found down on the ground in his room. It was unclear if he passed out or if he had mechanical fall. It was unclear how long that he bit down, possibly for several hours or most of the day. He is brought into the emergency room. Patient not a good historian although he says that he fell but does not remember this. He says he is not sure what happened.    ED Course: CT scan of the head and neck were unremarkable. No signs of any fractures or head bleed. Lab work was noteworthy for elevated troponin 0.04, lactic acid level 2.06 and normal white count and hemoglobin. Rest of labs are unremarkable. To calcitonin level at less than 0.10. Chest x-ray and urinalysis normal. Patient given a dose of IV Zosyn and vancomycin after blood cultures were done. Hospitalist will call for further evaluation.  Interim: Patient's mentation returned to baseline. Lactic acidosis resolved. On day of discharge, patient was alert and oriented x 3, without focal neurologic deficits, without acute complaints. He was agreeable to go back to SNF.   Discharge Diagnoses:  Active Problems:   Lactic acidosis   Dementia   Diabetes mellitus without complication (HCC)   COPD (chronic obstructive  pulmonary disease) (HCC)   Hypertension   Bipolar disorder ([place])   Fall -Etiology unclear if he had a vagal syncopal episode vs hypoglycemic episode (recently started glyburide) vs mechanical fall. He was found on the ground at nursing home. He does not recall events surrounding the fall. Also report of a fever prior to admission -CT head and cervical spine negative for acute abnormalities -No neurologic deficits on exam this morning, alert and oriented x 3  -PT evaluated patient, SNF recommended   DM type 2, with hyperglycemia -Continue glipizide (new med per SNF) and SSI, no report of hypoglycemia on admission. Please check blood sugars daily. Will hold metformin for now due to questionable contribution of lactic acidosis. Resume per PCP   Lactic acidosis -2.06 with resolution. Questionable effect of metformin? Unclear as patient's renal function is stable. Will hold for now, resume per PCP -No evidence of infection, WBC is normal, has been afebrile in hospital, procalcitonin negative -Received vanco/zosyn in ED. Stable without antibiotics now, with no source of infection found -UA negative -CXR without consolidation  -Blood cultures pending   HTN -Continue norvasc, lopressor   Dementia / bipolar disorder -Continue cogentin, zyprexa, xanax   Chronic back pain -Continue home oxy    Discharge Instructions  Discharge Instructions    Call MD for:    Complete by:  As directed    Fall, loss of consciousness, syncope   Call MD for:  difficulty breathing, headache or visual disturbances    Complete by:  As directed    Call MD for:  extreme fatigue  Complete by:  As directed    Call MD for:  hives    Complete by:  As directed    Call MD for:  persistant dizziness or light-headedness    Complete by:  As directed    Call MD for:  persistant nausea and vomiting    Complete by:  As directed    Call MD for:  severe uncontrolled pain    Complete by:  As directed    Call MD for:   temperature >100.4    Complete by:  As directed    Diet - low sodium heart healthy    Complete by:  As directed    Discharge instructions    Complete by:  As directed    You were cared for by a hospitalist during your hospital stay. If you have any questions about your discharge medications or the care you received while you were in the hospital after you are discharged, you can call the unit and asked to speak with the hospitalist on call if the hospitalist that took care of you is not available. Once you are discharged, your primary care physician will handle any further medical issues. Please note that NO REFILLS for any discharge medications will be authorized once you are discharged, as it is imperative that you return to your primary care physician (or establish a relationship with a primary care physician if you do not have one) for your aftercare needs so that they can reassess your need for medications and monitor your lab values.   Increase activity slowly    Complete by:  As directed      Allergies as of 10/28/2016   No Known Allergies     Medication List    STOP taking these medications   metFORMIN 1000 MG (MOD) 24 hr tablet Commonly known as:  GLUMETZA     TAKE these medications   acetaminophen 325 MG tablet Commonly known as:  TYLENOL Take 650 mg by mouth every 6 (six) hours as needed.   albuterol 108 (90 Base) MCG/ACT inhaler Commonly known as:  PROVENTIL HFA;VENTOLIN HFA Inhale 2 puffs into the lungs every 6 (six) hours as needed for wheezing or shortness of breath.   ALPRAZolam 1 MG tablet Commonly known as:  XANAX Take 1 mg by mouth at bedtime.   ALPRAZolam 0.5 MG tablet Commonly known as:  XANAX Take 0.5 mg by mouth every morning.   alum & mag hydroxide-simeth 510-258-52 MG/5ML suspension Commonly known as:  MAALOX PLUS Take 30 mLs by mouth 4 (four) times daily as needed for indigestion.   amLODipine 10 MG tablet Commonly known as:  NORVASC Take 10 mg  by mouth daily.   benztropine 1 MG tablet Commonly known as:  COGENTIN Take 1 mg by mouth 2 (two) times daily.   bisacodyl 5 MG EC tablet Commonly known as:  DULCOLAX Take 10 mg by mouth daily as needed for moderate constipation.   Fluticasone-Salmeterol 100-50 MCG/DOSE Aepb Commonly known as:  ADVAIR Inhale 1 puff into the lungs 2 (two) times daily.   glipiZIDE 5 MG tablet Commonly known as:  GLUCOTROL Take by mouth daily before breakfast.   hydrOXYzine 50 MG tablet Commonly known as:  ATARAX/VISTARIL Take 50 mg by mouth every 4 (four) hours as needed for anxiety.   insulin aspart 100 UNIT/ML injection Commonly known as:  novoLOG Inject 2-6 Units into the skin 3 (three) times daily before meals.   metoprolol tartrate 50 MG tablet Commonly known as:  LOPRESSOR Take 25 mg by mouth 2 (two) times daily.   nicotine 7 mg/24hr patch Commonly known as:  NICODERM CQ - dosed in mg/24 hr Place 7 mg onto the skin daily.   OLANZapine 10 MG tablet Commonly known as:  ZYPREXA Take 10 mg by mouth 2 (two) times daily.   oxycodone 5 MG capsule Commonly known as:  OXY-IR Take 5 mg by mouth every 8 (eight) hours as needed.   pregabalin 25 MG capsule Commonly known as:  LYRICA Take 25 mg by mouth 2 (two) times daily.   simethicone 80 MG chewable tablet Commonly known as:  MYLICON Chew 80 mg by mouth every 6 (six) hours as needed for flatulence.   tiotropium 18 MCG inhalation capsule Commonly known as:  SPIRIVA Place 18 mcg into inhaler and inhale daily.   traZODone 150 MG tablet Commonly known as:  DESYREL Take 150 mg by mouth at bedtime.            Discharge Care Instructions        Start     Ordered   10/28/16 0000  Increase activity slowly     10/28/16 1043   10/28/16 0000  Diet - low sodium heart healthy     10/28/16 1043   10/28/16 0000  Discharge instructions    Comments:  You were cared for by a hospitalist during your hospital stay. If you have any  questions about your discharge medications or the care you received while you were in the hospital after you are discharged, you can call the unit and asked to speak with the hospitalist on call if the hospitalist that took care of you is not available. Once you are discharged, your primary care physician will handle any further medical issues. Please note that NO REFILLS for any discharge medications will be authorized once you are discharged, as it is imperative that you return to your primary care physician (or establish a relationship with a primary care physician if you do not have one) for your aftercare needs so that they can reassess your need for medications and monitor your lab values.   10/28/16 1043   10/28/16 0000  Call MD for:  extreme fatigue     10/28/16 1043   10/28/16 0000  Call MD for:  persistant dizziness or light-headedness     10/28/16 1043   10/28/16 0000  Call MD for:  hives     10/28/16 1043   10/28/16 0000  Call MD for:  difficulty breathing, headache or visual disturbances     10/28/16 1043   10/28/16 0000  Call MD for:  severe uncontrolled pain     10/28/16 1043   10/28/16 0000  Call MD for:  persistant nausea and vomiting     10/28/16 1043   10/28/16 0000  Call MD for:  temperature >100.4     10/28/16 1043   10/28/16 0000  Call MD for:    Comments:  Fall, loss of consciousness, syncope   10/28/16 1043      No Known Allergies  Consultations:  None   Procedures/Studies: Dg Chest 2 View  Result Date: 10/27/2016 CLINICAL DATA:  Fall.  Fever. EXAM: CHEST  2 VIEW COMPARISON:  August 15, 2016 FINDINGS: There is no edema or consolidation. The heart size and pulmonary vascularity are normal. No adenopathy. There is aortic atherosclerosis. No evident pneumothorax. No bone lesion evident beyond degenerative change in the thoracic spine. IMPRESSION: No edema or consolidation.  There is  aortic atherosclerosis. Aortic Atherosclerosis (ICD10-I70.0). Electronically Signed    By: Lowella Grip III M.D.   On: 10/27/2016 12:56   Dg Lumbar Spine Complete  Result Date: 10/27/2016 CLINICAL DATA:  Pain following fall EXAM: LUMBAR SPINE - COMPLETE 4+ VIEW COMPARISON:  None. FINDINGS: Frontal, lateral, spot lumbosacral lateral, and bilateral oblique views were obtained. There are 5 non-rib-bearing lumbar type vertebral bodies. There is mid lumbar levoscoliosis with rotatory component. There is no demonstrable fracture or spondylolisthesis. There is marked disc space narrowing with vacuum phenomenon at L1-2 and L3-4 bilaterally. There is mild to moderate disc space narrowing elsewhere. There is facet osteoarthritic change at all levels bilaterally. There is aortoiliac atherosclerosis. IMPRESSION: Scoliosis. Multilevel arthropathy, most marked at L1-2 and L3-4. No acute fracture or spondylolisthesis. There is aortoiliac atherosclerosis. Aortic Atherosclerosis (ICD10-I70.0). Electronically Signed   By: Lowella Grip III M.D.   On: 10/27/2016 12:58   Ct Head Wo Contrast  Result Date: 10/27/2016 CLINICAL DATA:  Pain following fall EXAM: CT HEAD WITHOUT CONTRAST CT CERVICAL SPINE WITHOUT CONTRAST TECHNIQUE: Multidetector CT imaging of the head and cervical spine was performed following the standard protocol without intravenous contrast. Multiplanar CT image reconstructions of the cervical spine were also generated. COMPARISON:  Head CT August 18, 2016 ; cervical MRI March 15, 2016 FINDINGS: CT HEAD FINDINGS Brain: Mild to moderate diffuse atrophy is stable. There is no intracranial mass, hemorrhage, extra-axial fluid collection, or midline shift. There is slight small vessel disease in the centra semiovale bilaterally. Elsewhere gray-white compartments appear normal. No acute infarct evident. Vascular: There is no appreciable hyperdense vessel. Calcification is noted in each carotid siphon and distal internal carotid artery region. Skull: Bony calvarium appears intact. Sinuses/Orbits:  There is mucosal thickening in several ethmoid air cells bilaterally. Other paranasal sinuses are clear. Orbits appear symmetric bilaterally. Other: There is opacification in several mastoid air cells on the right, stable. Mastoid air cells on the left are clear. CT CERVICAL SPINE FINDINGS Alignment: There is mid cervical dextroscoliosis. There is 2 mm of retrolisthesis of C6 on C7. There is 3 mm of anterolisthesis of C7 on T1. Skull base and vertebrae: There is moderate pannus posterior to the odontoid, not causing appreciable impression on the craniocervical junction. Skull base and craniocervical junction regions otherwise appear unremarkable. There is bony remodeling at the superior aspect of C6 and inferior aspect of C5, stable from prior MRI, likely due to residual from old trauma. There is no acute fracture. There are no blastic or lytic bone lesions. Bones appear osteoporotic. Soft tissues and spinal canal: The prevertebral soft tissues and predental space regions are normal. The disc spaces appear unremarkable. Disc levels: There is ankylosis with remodeling at C5-6. There is marked disc space narrowing at C6-7 and C7-T1. There is multilevel facet osteoarthritic change. There is exit foraminal narrowing due to bony hypertrophy at C3-4 on the right, at C5-6 bilaterally, and at C6-7 on the right. There is no frank nerve root edema or effacement. No disc extrusion or stenosis evident. Upper chest: Visualized lung apices are clear. Other: There is carotid artery calcification bilaterally. IMPRESSION: CT head: Atrophy with slight periventricular small vessel disease. No intracranial mass, hemorrhage, or extra-axial fluid collection. No evident acute infarct. There are foci of arterial vascular calcification. Mild mucosal thickening noted in several ethmoid air cells. 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 CT cervical spine: 1. Residua of old trauma at C5-6 with remodeling of the C5 and C6 vertebral  bodies. Acute fracture.  2. Areas of mild spondylolisthesis at C6-7 and C7-T1, felt to be due to underlying spondylosis. 3. Multilevel arthropathy. No frank disc extrusion or high-grade stenosis. 4.  Bones osteoporotic. 5.  Bilateral carotid artery calcification. Electronically Signed   By: Lowella Grip III M.D.   On: 10/27/2016 13:33   Ct Cervical Spine Wo Contrast  Result Date: 10/27/2016 CLINICAL DATA:  Pain following fall EXAM: CT HEAD WITHOUT CONTRAST CT CERVICAL SPINE WITHOUT CONTRAST TECHNIQUE: Multidetector CT imaging of the head and cervical spine was performed following the standard protocol without intravenous contrast. Multiplanar CT image reconstructions of the cervical spine were also generated. COMPARISON:  Head CT August 18, 2016 ; cervical MRI March 15, 2016 FINDINGS: CT HEAD FINDINGS Brain: Mild to moderate diffuse atrophy is stable. There is no intracranial mass, hemorrhage, extra-axial fluid collection, or midline shift. There is slight small vessel disease in the centra semiovale bilaterally. Elsewhere gray-white compartments appear normal. No acute infarct evident. Vascular: There is no appreciable hyperdense vessel. Calcification is noted in each carotid siphon and distal internal carotid artery region. Skull: Bony calvarium appears intact. Sinuses/Orbits: There is mucosal thickening in several ethmoid air cells bilaterally. Other paranasal sinuses are clear. Orbits appear symmetric bilaterally. Other: There is opacification in several mastoid air cells on the right, stable. Mastoid air cells on the left are clear. CT CERVICAL SPINE FINDINGS Alignment: There is mid cervical dextroscoliosis. There is 2 mm of retrolisthesis of C6 on C7. There is 3 mm of anterolisthesis of C7 on T1. Skull base and vertebrae: There is moderate pannus posterior to the odontoid, not causing appreciable impression on the craniocervical junction. Skull base and craniocervical junction regions otherwise appear  unremarkable. There is bony remodeling at the superior aspect of C6 and inferior aspect of C5, stable from prior MRI, likely due to residual from old trauma. There is no acute fracture. There are no blastic or lytic bone lesions. Bones appear osteoporotic. Soft tissues and spinal canal: The prevertebral soft tissues and predental space regions are normal. The disc spaces appear unremarkable. Disc levels: There is ankylosis with remodeling at C5-6. There is marked disc space narrowing at C6-7 and C7-T1. There is multilevel facet osteoarthritic change. There is exit foraminal narrowing due to bony hypertrophy at C3-4 on the right, at C5-6 bilaterally, and at C6-7 on the right. There is no frank nerve root edema or effacement. No disc extrusion or stenosis evident. Upper chest: Visualized lung apices are clear. Other: There is carotid artery calcification bilaterally. IMPRESSION: CT head: Atrophy with slight periventricular small vessel disease. No intracranial mass, hemorrhage, or extra-axial fluid collection. No evident acute infarct. There are foci of arterial vascular calcification. Mild mucosal thickening noted in several ethmoid air cells. 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 CT cervical spine: 1. Residua of old trauma at C5-6 with remodeling of the C5 and C6 vertebral bodies. Acute fracture. 2. Areas of mild spondylolisthesis at C6-7 and C7-T1, felt to be due to underlying spondylosis. 3. Multilevel arthropathy. No frank disc extrusion or high-grade stenosis. 4.  Bones osteoporotic. 5.  Bilateral carotid artery calcification. Electronically Signed   By: Lowella Grip III M.D.   On: 10/27/2016 13:33       Discharge Exam: Vitals:   10/28/16 0739 10/28/16 0848  BP:  (!) 152/79  Pulse: 68 79  Resp: 18 18  Temp:  97.7 F (36.5 C)  SpO2: 98% 97%   Vitals:   10/27/16 2102 10/28/16 5366 10/28/16 0739 10/28/16 0848  BP: (!) 164/73 (!) 154/67  (!) 152/79  Pulse: 76 62 68 79  Resp: 18  18 18 18   Temp: 99 F (37.2 C) 99 F (37.2 C)  97.7 F (36.5 C)  TempSrc: Oral Oral  Oral  SpO2: 97% 96% 98% 97%  Weight:      Height:        General: Pt is alert, awake, not in acute distress Cardiovascular: RRR, S1/S2 +, no rubs, no gallops Respiratory: CTA bilaterally, no wheezing, no rhonchi Abdominal: Soft, NT, ND, bowel sounds + Extremities: no edema, no cyanosis Neuro: Alert, oriented x 3, speech clear, no focal deficits, CN 2-12 without gross abnormalities, moving all extremities appropriately     The results of significant diagnostics from this hospitalization (including imaging, microbiology, ancillary and laboratory) are listed below for reference.     Microbiology: Recent Results (from the past 240 hour(s))  Blood Culture (routine x 2)     Status: None (Preliminary result)   Collection Time: 10/27/16 12:51 PM  Result Value Ref Range Status   Specimen Description BLOOD RIGHT FOREARM  Final   Special Requests   Final    BOTTLES DRAWN AEROBIC AND ANAEROBIC Blood Culture adequate volume   Culture   Final    NO GROWTH < 24 HOURS Performed at [facility], [address], [place], [place] [address]    Report Status PENDING  Incomplete  Blood Culture (routine x 2)     Status: None (Preliminary result)   Collection Time: 10/27/16 12:52 PM  Result Value Ref Range Status   Specimen Description BLOOD LEFT FOREARM  Final   Special Requests   Final    BOTTLES DRAWN AEROBIC AND ANAEROBIC Blood Culture adequate volume   Culture   Final    NO GROWTH < 24 HOURS Performed at [facility], [place] [address] [place], [place] [address]    Report Status PENDING  Incomplete  MRSA PCR Screening     Status: None   Collection Time: 10/27/16  9:15 PM  Result Value Ref Range Status   MRSA by PCR NEGATIVE NEGATIVE Final    Comment:        The GeneXpert MRSA Assay (FDA approved for NASAL specimens only), is one component of a comprehensive MRSA  colonization surveillance program. It is not intended to diagnose MRSA infection nor to guide or monitor treatment for MRSA infections.      Labs: BNP (last 3 results) No results for input(s): BNP in the last 8760 hours. Basic Metabolic Panel:  Recent Labs Lab 10/27/16 1252 10/28/16 0441  NA 138 139  K 4.1 3.7  CL 103 109  CO2 25 25  GLUCOSE 260* 281*  BUN 9 7  CREATININE 0.72 0.65  CALCIUM 9.7 8.7*   Liver Function Tests:  Recent Labs Lab 10/27/16 1252  AST 29  ALT 24  ALKPHOS 91  BILITOT 0.7  PROT 7.0  ALBUMIN 4.3   No results for input(s): LIPASE, AMYLASE in the last 168 hours. No results for input(s): AMMONIA in the last 168 hours. CBC:  Recent Labs Lab 10/27/16 1252 10/28/16 0441  WBC 5.8 4.4  NEUTROABS 4.6  --   HGB 12.7* 10.9*  HCT 37.5* 32.0*  MCV 84.7 85.6  PLT 92* 84*   Cardiac Enzymes:  Recent Labs Lab 10/27/16 1252  TROPONINI 0.04*   BNP: Invalid input(s): POCBNP CBG:  Recent Labs Lab 10/27/16 2039 10/28/16 0809  GLUCAP 286* 329*   D-Dimer No  results for input(s): DDIMER in the last 72 hours. Hgb A1c No results for input(s): HGBA1C in the last 72 hours. Lipid Profile No results for input(s): CHOL, HDL, LDLCALC, TRIG, CHOLHDL, LDLDIRECT in the last 72 hours. Thyroid function studies No results for input(s): TSH, T4TOTAL, T3FREE, THYROIDAB in the last 72 hours.  Invalid input(s): FREET3 Anemia work up No results for input(s): VITAMINB12, FOLATE, FERRITIN, TIBC, IRON, RETICCTPCT in the last 72 hours. Urinalysis    Component Value Date/Time   COLORURINE STRAW (A) 10/27/2016 1501   APPEARANCEUR CLEAR 10/27/2016 1501   LABSPEC 1.011 10/27/2016 1501   PHURINE 7.0 10/27/2016 1501   GLUCOSEU >=500 (A) 10/27/2016 1501   HGBUR NEGATIVE 10/27/2016 1501   BILIRUBINUR NEGATIVE 10/27/2016 1501   KETONESUR NEGATIVE 10/27/2016 1501   PROTEINUR NEGATIVE 10/27/2016 1501   NITRITE NEGATIVE 10/27/2016 1501   LEUKOCYTESUR NEGATIVE  10/27/2016 1501   Sepsis Labs Invalid input(s): PROCALCITONIN,  WBC,  LACTICIDVEN Microbiology Recent Results (from the past 240 hour(s))  Blood Culture (routine x 2)     Status: None (Preliminary result)   Collection Time: 10/27/16 12:51 PM  Result Value Ref Range Status   Specimen Description BLOOD RIGHT FOREARM  Final   Special Requests   Final    BOTTLES DRAWN AEROBIC AND ANAEROBIC Blood Culture adequate volume   Culture   Final    NO GROWTH < 24 HOURS Performed at [facility], [place] [address], [place], [place] [address]    Report Status PENDING  Incomplete  Blood Culture (routine x 2)     Status: None (Preliminary result)   Collection Time: 10/27/16 12:52 PM  Result Value Ref Range Status   Specimen Description BLOOD LEFT FOREARM  Final   Special Requests   Final    BOTTLES DRAWN AEROBIC AND ANAEROBIC Blood Culture adequate volume   Culture   Final    NO GROWTH < 24 HOURS Performed at [facility], [place] [address] [place],  [address]    Report Status PENDING  Incomplete  MRSA PCR Screening     Status: None   Collection Time: 10/27/16  9:15 PM  Result Value Ref Range Status   MRSA by PCR NEGATIVE NEGATIVE Final    Comment:        The GeneXpert MRSA Assay (FDA approved for NASAL specimens only), is one component of a comprehensive MRSA colonization surveillance program. It is not intended to diagnose MRSA infection nor to guide or monitor treatment for MRSA infections.      Time coordinating discharge: 40 minutes  SIGNED:  Dessa Phi, DO [facility] Pager [phone]  If 7PM-7AM, please contact night-coverage [email or web address] Password [facility] 10/28/2016, 10:44 AM

## 2016-11-01 LAB — CULTURE, BLOOD (ROUTINE X 2)
Culture: NO GROWTH
Culture: NO GROWTH
SPECIAL REQUESTS: ADEQUATE
Special Requests: ADEQUATE

## 2017-04-10 ENCOUNTER — Emergency Department (HOSPITAL_COMMUNITY): Payer: Medicare Other

## 2017-04-10 ENCOUNTER — Emergency Department (HOSPITAL_COMMUNITY)
Admission: EM | Admit: 2017-04-10 | Discharge: 2017-04-10 | Disposition: A | Discharge status: Rehabilitation Facility | Payer: Medicare Other | Attending: Physician Assistant | Admitting: Physician Assistant

## 2017-04-10 ENCOUNTER — Encounter (HOSPITAL_COMMUNITY): Payer: Self-pay | Admitting: Emergency Medicine

## 2017-04-10 DIAGNOSIS — J449 Chronic obstructive pulmonary disease, unspecified: Secondary | ICD-10-CM | POA: Insufficient documentation

## 2017-04-10 DIAGNOSIS — I10 Essential (primary) hypertension: Secondary | ICD-10-CM | POA: Insufficient documentation

## 2017-04-10 DIAGNOSIS — Z794 Long term (current) use of insulin: Secondary | ICD-10-CM | POA: Insufficient documentation

## 2017-04-10 DIAGNOSIS — Z79899 Other long term (current) drug therapy: Secondary | ICD-10-CM | POA: Diagnosis not present

## 2017-04-10 DIAGNOSIS — E119 Type 2 diabetes mellitus without complications: Secondary | ICD-10-CM | POA: Diagnosis not present

## 2017-04-10 DIAGNOSIS — F0390 Unspecified dementia without behavioral disturbance: Secondary | ICD-10-CM | POA: Insufficient documentation

## 2017-04-10 DIAGNOSIS — R05 Cough: Secondary | ICD-10-CM | POA: Diagnosis present

## 2017-04-10 DIAGNOSIS — J189 Pneumonia, unspecified organism: Secondary | ICD-10-CM | POA: Diagnosis not present

## 2017-04-10 DIAGNOSIS — J181 Lobar pneumonia, unspecified organism: Secondary | ICD-10-CM

## 2017-04-10 MED ORDER — AZITHROMYCIN 250 MG PO TABS: 250.0000 mg | ORAL_TABLET | Freq: Once | ORAL | 0 refills | Status: AC

## 2017-04-10 MED ORDER — AZITHROMYCIN 250 MG PO TABS
500.0000 mg | ORAL_TABLET | Freq: Once | ORAL | Status: AC
  Administered 2017-04-10: 500 mg via ORAL
  Filled 2017-04-10: qty 2

## 2017-04-10 NOTE — ED Notes (Signed)
PTAR at bedside 

## 2017-04-10 NOTE — ED Notes (Signed)
Attempted to call report to [facility] with no response

## 2017-04-10 NOTE — ED Triage Notes (Signed)
Patient here via EMS from [facility] with complaints of dry nonproductive cough. Reports right side pain with inspiration. Nurse reported fever or 100.1 yesterday.Tylenlol yesterday with relief.

## 2017-04-10 NOTE — ED Notes (Signed)
Attempted to call report again to [facility] with no answer

## 2017-04-10 NOTE — ED Notes (Signed)
Pt has a hx of dementia and did not remember RN going over discharge paperwork. So, RN attempted to call reports to [facility] x3, but got no response. RN gave report to PTAR who said they would pass the information along to the staff at the facility. RN did not get pt signature as he was confused at the time.

## 2017-04-10 NOTE — Discharge Instructions (Signed)
Please take the antibiotics provided every day for the next 4 days starting tomorrow.  Please follow-up if you are having any increasing cough or shortness of breath.

## 2017-04-10 NOTE — ED Provider Notes (Signed)
[facility] DEPT Provider Note   CSN: [id number] Arrival date & time: 04/10/17  1884     History   Chief Complaint Chief Complaint  Patient presents with  . Cough    HPI Andrew Ward is a 72 y.o. male.  HPI   72 y.o.malewith medical history significant for dementia, bipolar disorder, COPD and diabetes mellitus who resides in a skilled nursing facility presenting today with cough and right-sided chest pain with cough.  Patient has been eating and drinking normally.  Denies any shortness of breath or difficulty breathing.      Past Medical History:  Diagnosis Date  . Anxiety   . COPD (chronic obstructive pulmonary disease) ([place])   . Dementia   . Depression   . Diabetes mellitus without complication ([place])   . Hypertension     Patient Active Problem List   Diagnosis Date Noted  . Lactic acidosis 10/27/2016  . Dementia 10/27/2016  . Diabetes mellitus without complication ([place]) 16/60/6301  . COPD (chronic obstructive pulmonary disease) ([place]) 10/27/2016  . Hypertension 10/27/2016  . Bipolar disorder ([place]) 10/27/2016    History reviewed. No pertinent surgical history.     Home Medications    Prior to Admission medications   Medication Sig Start Date End Date Taking? Authorizing Provider  acetaminophen (TYLENOL) 325 MG tablet Take 650 mg by mouth every 6 (six) hours as needed.    [provider]  albuterol (PROVENTIL HFA;VENTOLIN HFA) 108 (90 Base) MCG/ACT inhaler Inhale 2 puffs into the lungs every 6 (six) hours as needed for wheezing or shortness of breath.    [provider]  ALPRAZolam Duanne Moron) 0.5 MG tablet Take 0.5 mg by mouth every morning.    [provider]  ALPRAZolam Duanne Moron) 1 MG tablet Take 1 mg by mouth at bedtime.    [provider]  alum & mag hydroxide-simeth (MAALOX PLUS) 400-400-40 MG/5ML suspension Take 30 mLs by mouth 4 (four) times daily as needed for indigestion.     [provider]  amLODipine (NORVASC) 10 MG tablet Take 10 mg by mouth daily.    [provider]  benztropine (COGENTIN) 1 MG tablet Take 1 mg by mouth 2 (two) times daily.    [provider]  bisacodyl (DULCOLAX) 5 MG EC tablet Take 10 mg by mouth daily as needed for moderate constipation.    [provider]  Fluticasone-Salmeterol (ADVAIR) 100-50 MCG/DOSE AEPB Inhale 1 puff into the lungs 2 (two) times daily.    [provider]  glipiZIDE (GLUCOTROL) 5 MG tablet Take by mouth daily before breakfast.    [provider]  hydrOXYzine (ATARAX/VISTARIL) 50 MG tablet Take 50 mg by mouth every 4 (four) hours as needed for anxiety.    [provider]  insulin aspart (NOVOLOG) 100 UNIT/ML injection Inject 2-6 Units into the skin 3 (three) times daily before meals.    [provider]  metoprolol tartrate (LOPRESSOR) 50 MG tablet Take 25 mg by mouth 2 (two) times daily.    [provider]  nicotine (NICODERM CQ - DOSED IN MG/24 HR) 7 mg/24hr patch Place 7 mg onto the skin daily.    [provider]  OLANZapine (ZYPREXA) 10 MG tablet Take 10 mg by mouth 2 (two) times daily.    [provider]  oxycodone (OXY-IR) 5 MG capsule Take 5 mg by mouth every 8 (eight) hours as needed.    [provider]  pregabalin (LYRICA) 25 MG capsule Take 25  mg by mouth 2 (two) times daily.    [provider]  simethicone (MYLICON) 80 MG chewable tablet Chew 80 mg by mouth every 6 (six) hours as needed for flatulence.    [provider]  tiotropium (SPIRIVA) 18 MCG inhalation capsule Place 18 mcg into inhaler and inhale daily.    [provider]  traZODone (DESYREL) 150 MG tablet Take 150 mg by mouth at bedtime.    [provider]    Family History No family history on file.  Social History Social History   Tobacco Use  . Smoking status: Former Research scientist (life sciences)  . Smokeless tobacco: Never  Used  Substance Use Topics  . Alcohol use: No  . Drug use: No     Allergies   Fentanyl; Atorvastatin; and Duloxetine   Review of Systems Review of Systems  Constitutional: Negative for activity change, fatigue and fever.  Respiratory: Positive for cough. Negative for shortness of breath.   Cardiovascular: Negative for chest pain.  Gastrointestinal: Negative for abdominal pain.  All other systems reviewed and are negative.    Physical Exam Updated Vital Signs BP 128/76 (BP Location: Left Arm)   Pulse 88   Temp 98.1 F (36.7 C) (Oral)   Resp 16   Ht 5\' 11"  (1.803 m)   Wt 77.1 kg (170 lb)   SpO2 96%   BMI 23.71 kg/m   Physical Exam  Constitutional: He is oriented to person, place, and time. He appears well-nourished.  HENT:  Head: Normocephalic.  Eyes: Conjunctivae are normal. Right eye exhibits no discharge. Left eye exhibits no discharge.  Cardiovascular: Normal rate and regular rhythm.  Pulmonary/Chest: Effort normal and breath sounds normal. No stridor. No respiratory distress. He has no wheezes.  Abdominal: Soft. Bowel sounds are normal. He exhibits no distension.  Neurological: He is oriented to person, place, and time.  Skin: Skin is warm and dry. He is not diaphoretic.  Psychiatric: He has a normal mood and affect. His behavior is normal.     ED Treatments / Results  Labs (all labs ordered are listed, but only abnormal results are displayed) Labs Reviewed - No data to display  EKG  EKG Interpretation None       Radiology Dg Chest 2 View  Result Date: 04/10/2017 CLINICAL DATA:  Cough EXAM: CHEST  2 VIEW COMPARISON:  10/27/2016 chest radiograph. FINDINGS: Stable cardiomediastinal silhouette with normal heart size. No pneumothorax. No pleural effusion. Patchy right lung base opacity. IMPRESSION: Patchy right lung base opacity most compatible with pneumonia. Recommend follow-up PA and lateral post treatment chest radiographs in 4-6 weeks. Electronically  Signed   By: Ilona Sorrel M.D.   On: 04/10/2017 10:24    Procedures Procedures (including critical care time)  Medications Ordered in ED Medications - No data to display   Initial Impression / Assessment and Plan / ED Course  I have reviewed the triage vital signs and the nursing notes.  Pertinent labs & imaging results that were available during my care of the patient were reviewed by me and considered in my medical decision making (see chart for details).       72 y.o.malewith medical history significant for dementia, bipolar disorder, COPD and diabetes mellitus who resides in a skilled nursing facility presenting today with cough and right-sided chest pain with cough.  Patient has been eating and drinking normally.  Denies any shortness of breath or difficulty breathing..  51:15 AM 72 year old male presenting with cough and right sided pleuritic chest  pain.  Pneumonia seen on x-ray.  Patient has normal vital signs eating and drinking normally.  Patient has no tachypnea no hypoxia.  Will send home with p.o. antibiotic trial.  Final Clinical Impressions(s) / ED Diagnoses   Final diagnoses:  None    ED Discharge Orders    None       Macarthur Critchley, MD 04/11/17 1548

## 2017-04-10 NOTE — ED Notes (Signed)
Bed: WA03 Expected date:  Expected time:  Means of arrival:  Comments: 

## 2017-06-29 ENCOUNTER — Other Ambulatory Visit: Payer: Self-pay | Admitting: Internal Medicine

## 2017-06-29 DIAGNOSIS — J690 Pneumonitis due to inhalation of food and vomit: Secondary | ICD-10-CM

## 2017-07-07 ENCOUNTER — Ambulatory Visit
Admission: RE | Admit: 2017-07-07 | Discharge: 2017-07-07 | Disposition: A | Discharge status: Home / Self Care | Payer: Medicare Other | Source: Ambulatory Visit | Attending: Internal Medicine | Admitting: Internal Medicine

## 2017-07-07 DIAGNOSIS — J690 Pneumonitis due to inhalation of food and vomit: Secondary | ICD-10-CM

## 2017-07-07 MED ORDER — IOPAMIDOL (ISOVUE-300) INJECTION 61%
75.0000 mL | Freq: Once | INTRAVENOUS | Status: AC | PRN
  Administered 2017-07-07: 75 mL via INTRAVENOUS

## 2018-01-09 ENCOUNTER — Other Ambulatory Visit: Payer: Self-pay | Admitting: Internal Medicine

## 2018-01-09 DIAGNOSIS — R911 Solitary pulmonary nodule: Secondary | ICD-10-CM

## 2018-01-16 ENCOUNTER — Ambulatory Visit
Admission: RE | Admit: 2018-01-16 | Discharge: 2018-01-16 | Disposition: A | Discharge status: Home / Self Care | Payer: Medicare Other | Source: Ambulatory Visit | Attending: Internal Medicine | Admitting: Internal Medicine

## 2018-01-16 DIAGNOSIS — R911 Solitary pulmonary nodule: Secondary | ICD-10-CM

## 2018-02-17 ENCOUNTER — Institutional Professional Consult (permissible substitution): Payer: Medicare Other | Admitting: Internal Medicine

## 2018-04-20 ENCOUNTER — Ambulatory Visit (INDEPENDENT_AMBULATORY_CARE_PROVIDER_SITE_OTHER): Payer: Medicare Other | Admitting: Internal Medicine

## 2018-04-20 ENCOUNTER — Encounter: Payer: Self-pay | Admitting: Internal Medicine

## 2018-04-20 VITALS — BP 102/60 | HR 72

## 2018-04-20 DIAGNOSIS — J449 Chronic obstructive pulmonary disease, unspecified: Secondary | ICD-10-CM

## 2018-04-20 DIAGNOSIS — R918 Other nonspecific abnormal finding of lung field: Secondary | ICD-10-CM

## 2018-04-20 NOTE — Patient Instructions (Addendum)
Based on the severity of your copd there is no easy way to make a diagnosis or treat the lung nodules at this point so best to just watch them and if it looks like any are growing larger than others and they are bothering you we could consider then some form of biopsy.  Please schedule a follow up visit in 3 months but call sooner if needed with cxr on return  - have your son come with you if possible

## 2018-04-20 NOTE — Progress Notes (Signed)
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 Lianne Ward but he cannot recall any operation or who has followed him for this but records in care everywhere indicate the nodules have been followed at [facility] and past PET's show borderline uptake      History of Present Illness  04/20/2018  Pulmonary/ 1st office eval/Andrew Ward  Chief Complaint  Patient presents with  . Pulmonary Consult    Referred by Dr. Virgel Ward for eval of lung nodule.    Dyspnea:  sev hundred feet slow pace = MMRC3 = can't walk 100 yards even at a slow pace at a flat grade s stopping due to sob   Cough: none Sleep: sleeps ok / able to lie flat ok  SABA use: not clear which ones he's using  / very confused with details of care  No obvious day to day or daytime variability or assoc excess/ purulent sputum or mucus plugs or hemoptysis or cp or chest tightness, subjective wheeze or overt sinus or hb symptoms.   Sleeping flat  without nocturnal  or early am exacerbation  of respiratory  c/o's or need for noct saba. Also denies any obvious fluctuation of symptoms with weather or environmental changes or other aggravating or alleviating factors except as outlined above   No unusual exposure hx or h/o childhood pna/ asthma or knowledge of premature birth.  Current Allergies, Complete Past Medical History, Past Surgical History, Family History, and Social History were reviewed in [employer] record.  ROS  The following are not active complaints unless bolded Hoarseness, sore throat, dysphagia, dental problems, itching, sneezing,   nasal congestion or discharge of excess mucus or purulent secretions, ear ache,   fever, chills, sweats, unintended wt loss or wt gain, classically pleuritic or exertional cp,  orthopnea pnd or arm/hand swelling  or leg swelling, presyncope, palpitations, abdominal pain, anorexia, nausea, vomiting, diarrhea  or change in bowel habits or change in bladder habits, change in stools or change in urine, dysuria, hematuria,  rash, arthralgias, visual complaints, headache, numbness, weakness or ataxia or problems with walking or coordination,  change in mood or  memory.           Past Medical History:  Diagnosis Date  . Anxiety   . COPD (chronic obstructive pulmonary disease) ([place])   . Dementia ([place])   . Depression   . Diabetes mellitus without complication ([place])   . Hypertension     Outpatient Medications Prior to Visit  Medication Sig Dispense Refill  . acetaminophen (TYLENOL) 325 MG tablet Take 650 mg by mouth every 6 (six) hours as needed.    Marland Kitchen albuterol (PROVENTIL HFA;VENTOLIN HFA) 108 (90 Base) MCG/ACT inhaler Inhale 2 puffs into the lungs every 6 (six) hours as needed for wheezing or shortness of breath.    . ALPRAZolam (XANAX) 1 MG tablet Take 1 mg by mouth at bedtime.    Marland Kitchen alum & mag hydroxide-simeth (MAALOX PLUS) 400-400-40 MG/5ML suspension Take 30 mLs by mouth 4 (four) times daily as needed  for indigestion.    Marland Kitchen amLODipine (NORVASC) 5 MG tablet Take 5 mg by mouth daily.     Marland Kitchen amoxicillin-clavulanate (AUGMENTIN) 875-125 MG tablet Take 1 tablet by mouth 2 (two) times daily.    Marland Kitchen atorvastatin (LIPITOR) 40 MG tablet Take 40 mg by mouth daily.    . benztropine (COGENTIN) 1 MG tablet Take 1 mg by mouth 2 (two) times daily.    . bisacodyl (DULCOLAX) 5 MG EC tablet Take 10 mg by mouth daily as needed for moderate constipation.    . busPIRone (BUSPAR) 10 MG tablet Take 10 mg by mouth See admin instructions. Take 1 tablet by mouth twice daily scheduled and up to 3 additional times as needed for  anxiety    . dextromethorphan-guaiFENesin (MUCINEX DM) 30-600 MG 12hr tablet Take 1 tablet by mouth at bedtime.    . Fluticasone-Salmeterol (ADVAIR) 100-50 MCG/DOSE AEPB Inhale 1 puff into the lungs 2 (two) times daily.    Marland Kitchen glipiZIDE (GLUCOTROL) 10 MG tablet Take 10 mg by mouth 2 (two) times daily before a meal.     . ipratropium-albuterol (DUONEB) 0.5-2.5 (3) MG/3ML SOLN 1 vial in neb every 8 hours as needed    . losartan (COZAAR) 50 MG tablet Take 50 mg by mouth daily.    . metFORMIN (GLUCOPHAGE) 1000 MG tablet Take 1,000 mg by mouth 2 (two) times daily.    . metoprolol tartrate (LOPRESSOR) 25 MG tablet Take 25 mg by mouth 2 (two) times daily.    Marland Kitchen OLANZapine (ZYPREXA) 10 MG tablet Take 10 mg by mouth at bedtime.    Marland Kitchen oxycodone (OXY-IR) 5 MG capsule Take 5 mg by mouth as needed for pain.     . pantoprazole (PROTONIX) 40 MG tablet Take 40 mg by mouth daily.    . pregabalin (LYRICA) 25 MG capsule Take 25 mg by mouth 2 (two) times daily.    . sertraline (ZOLOFT) 50 MG tablet Take 50 mg by mouth daily.    . simethicone (MYLICON) 80 MG chewable tablet Chew 80 mg by mouth every 6 (six) hours as needed for flatulence.    . tiotropium (SPIRIVA) 18 MCG inhalation capsule Place 18 mcg into inhaler and inhale daily.    . traZODone (DESYREL) 150 MG tablet Take 150 mg by mouth at bedtime.    . insulin aspart (NOVOLOG) 100 UNIT/ML injection Inject 2-6 Units into the skin 3 (three) times daily before meals. 300-350: 2 units 351-400:4 units 401-800: 6 units    . OLANZapine (ZYPREXA) 10 MG tablet Take 10 mg by mouth 2 (two) times daily.    Marland Kitchen omeprazole (PRILOSEC) 20 MG capsule Take 20 mg by mouth daily.        Objective:     BP 102/60 (BP Location: Left Arm, Cuff Size: Normal)   Pulse 72   SpO2 97%   SpO2: 97 % RA    Wt Readings from Last 3 Encounters:  04/10/17 170 lb (77.1 kg)  10/27/16 167 lb 5.3 oz (75.9 kg)     Vital signs reviewed - Note on arrival 02 sats  97% on RA      HEENT:  Edentulous / oropharynx. Nl external ear canals without cough reflex -  Mild bilateral non-specific turbinate edema     NECK :  without JVD/Nodes/TM/ nl carotid upstrokes bilaterally   LUNGS: no acc muscle use,  Mild/mod barrel  contour chest wall with bilateral  Distant bs s audible wheeze and  without cough on insp or  exp maneuver and mild/mod  Hyperresonant  to  percussion bilaterally     CV:  RRR  no s3 or murmur or increase in P2, and no edema   ABD:  soft and nontender with pos mid  insp Hoover's  in the supine position. No bruits or organomegaly appreciated, bowel sounds nl  MS:   Nl gait/  ext warm without deformities, calf tenderness, cyanosis or clubbing No obvious joint restrictions   SKIN: warm and dry without lesions    NEURO:  Alert, doesn't really know why he's here, thinks Biden is VP now but otherwise oriented to person/place/ month and day of week, president -   no motor or cerebellar deficits apparent.     I personally reviewed images and agree with radiology impression as follows:   Chest CT   01/16/2018 1. Interval progression of bilateral pulmonary nodules measuring up to 13 mm on today's exam. Imaging features concerning for metastatic disease. PET-CT may be warranted to further evaluate. 2. The relatively diffuse circumferential bronchial wall thickening, airway impaction, and peripheral tree-in-bud nodularity seen in the right lower lung on the previous study have almost completely resolved in the interval. Imaging features suggest marked improvement in atypical infection. 3. Nodular liver contour suggests cirrhosis. 4.  Aortic Atherosclerois (ICD10-170.0) 5.  Emphysema. (MEQ68-T41.9)       Assessment   No problem-specific Assessment & Plan notes found for this encounter.     Christinia Gully, MD 04/20/2018

## 2018-04-21 ENCOUNTER — Telehealth: Payer: Self-pay | Admitting: Internal Medicine

## 2018-04-21 ENCOUNTER — Encounter: Payer: Self-pay | Admitting: Internal Medicine

## 2018-04-21 DIAGNOSIS — R918 Other nonspecific abnormal finding of lung field: Secondary | ICD-10-CM | POA: Insufficient documentation

## 2018-04-21 NOTE — Telephone Encounter (Signed)
Done  Consult note from 04/20/2018 was faxed to the number provided

## 2018-04-21 NOTE — Assessment & Plan Note (Signed)
Stage I adenocarcinoma of the lung with right upper lobectomy and right middle lobe wedge 10/2014 by Dr. Lianne Moris at [facility] Lung nodules 05/31/2014  Overview:   Right upper lobe 2.2x 4.1 cm with SUV max 1.5 on 4- 16 PET   Neck nodule 05/31/2014  Overview:   11x18 mm on 4- 16 PET with SUV max of 2.5       Since he had borderline PET in 2016 this is c/w adenoca slow growing met dz and best handled back to [facility] but if fm desires aggressive rx then PET could be repeated and referred to oncology to determine whether a tissue dx is really needed and would strongly favor the neck nodule or other non-thoracic activity based on underlying emphysema and attendant risks for lung bx.    Have asked his POA to come to office for f/u in 3 m if not referred  Back to [facility] in meantime.

## 2018-04-21 NOTE — Assessment & Plan Note (Addendum)
Still actively smoking "we he can" - Spirometry 04/20/2018  FEV1 0.9 (27%)  Ratio 0.30  -04/20/2018   Walked RA  2 laps @  approx 259ft each @ slow pace  stopped due to  End of study, nl sats/ denied sob   He is well compensated on present rx and so sedentary that he doesn't suffer from doe from emphysema or have a significant issue with AB at this point so no change in rx needed - I don't think he's really GOLD IV but this is a moot issue at this point as additional T surgery is not indicated at this point.

## 2018-07-21 ENCOUNTER — Ambulatory Visit: Payer: Medicare Other | Admitting: Internal Medicine

## 2018-09-01 ENCOUNTER — Ambulatory Visit: Payer: Medicare Other | Admitting: Internal Medicine

## 2018-09-10 IMAGING — DX DG LUMBAR SPINE COMPLETE 4+V
5 series · 5 of 5 positions shown · non-contrast
Comparison: None.

CLINICAL DATA: Pain following fall

EXAM:
LUMBAR SPINE - COMPLETE 4+ VIEW

[l-spine ap]
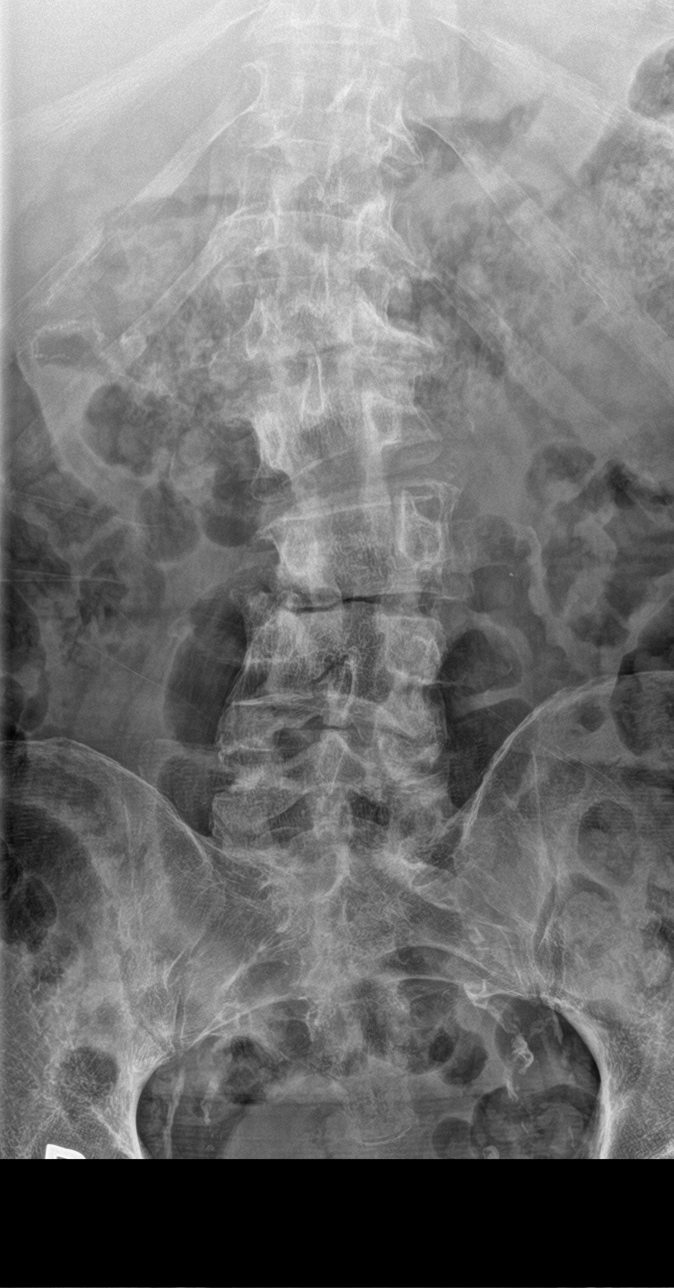

[l-spine obl (1 of 2)]
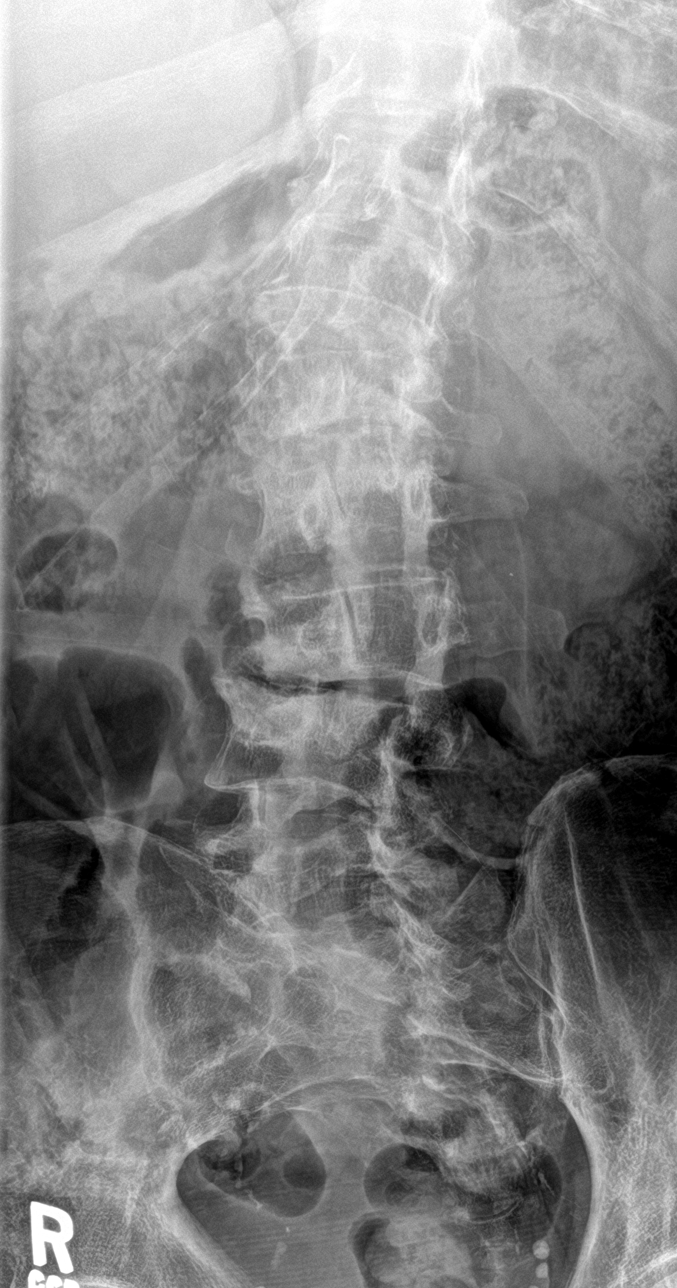

[l-spine obl (2 of 2)]
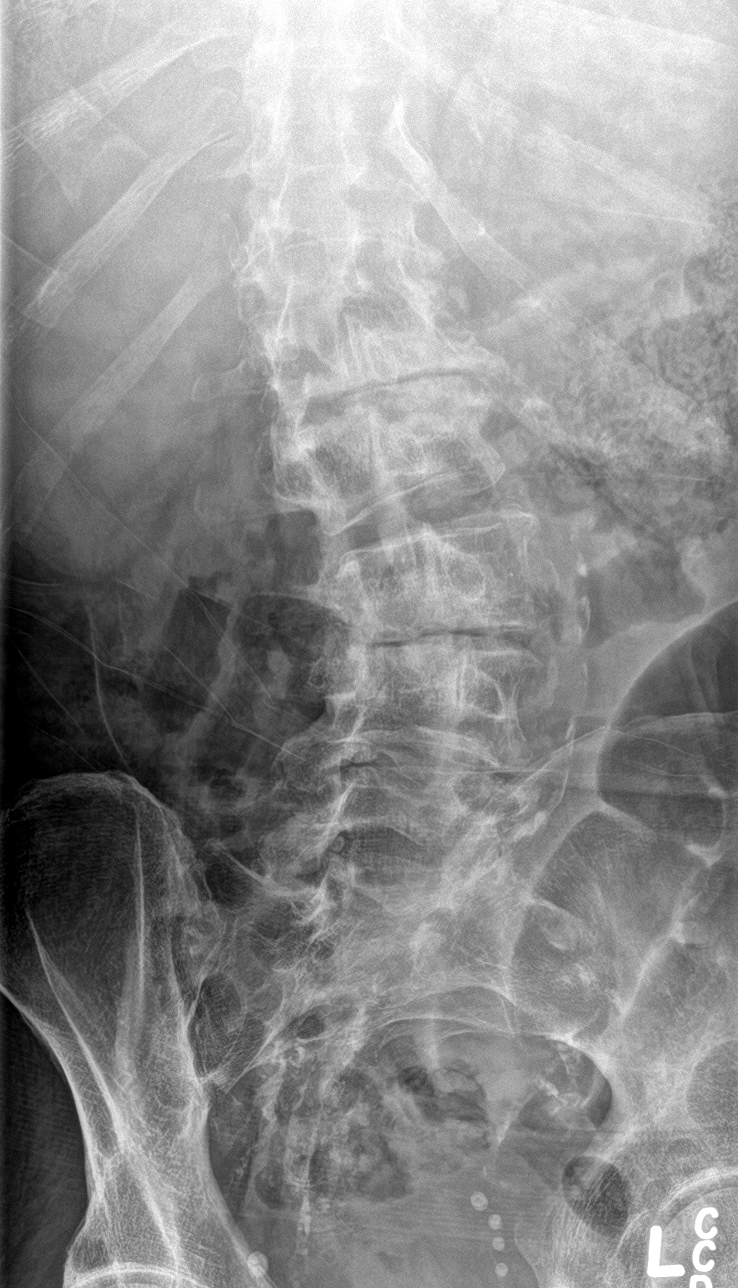

[l-spine lat]
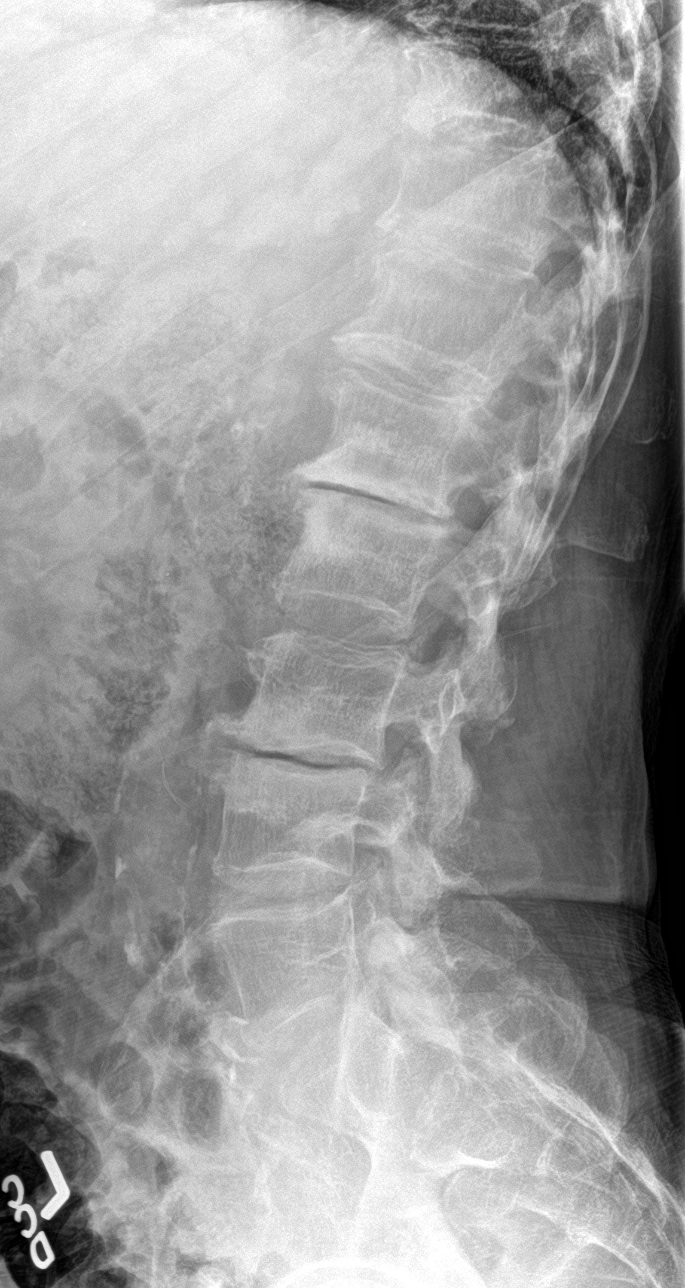

[l-spine spot]
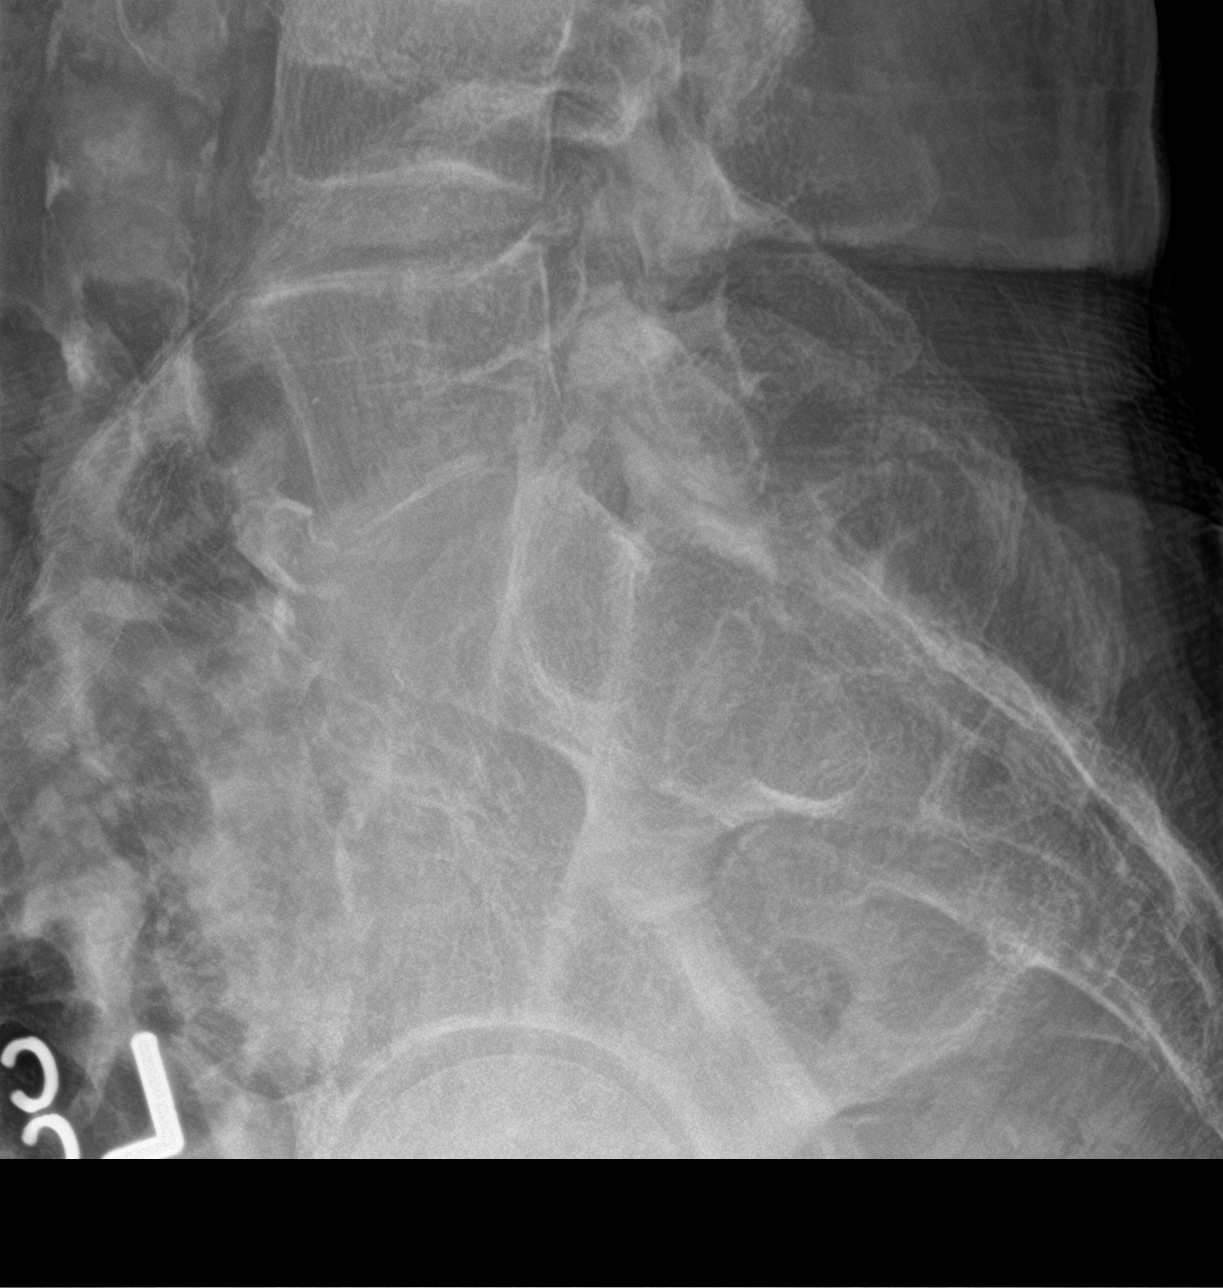

[5 of 5 positions shown; findings below may reference images not displayed]

FINDINGS: Frontal, lateral, spot lumbosacral lateral, and bilateral oblique
views were obtained. There are 5 non-rib-bearing lumbar type
vertebral bodies. There is mid lumbar levoscoliosis with rotatory
component. There is no demonstrable fracture or spondylolisthesis.
There is marked disc space narrowing with vacuum phenomenon at L1-2
and L3-4 bilaterally. There is mild to moderate disc space narrowing
elsewhere. There is facet osteoarthritic change at all levels
bilaterally. There is aortoiliac atherosclerosis.
IMPRESSION: Scoliosis. Multilevel arthropathy, most marked at L1-2 and L3-4. No
acute fracture or spondylolisthesis. There is aortoiliac
atherosclerosis.

Aortic Atherosclerosis ([id number]).

## 2018-09-12 ENCOUNTER — Ambulatory Visit: Payer: Medicare Other | Admitting: Internal Medicine

## 2019-08-06 ENCOUNTER — Other Ambulatory Visit: Payer: Self-pay

## 2019-08-06 ENCOUNTER — Ambulatory Visit (INDEPENDENT_AMBULATORY_CARE_PROVIDER_SITE_OTHER): Payer: Medicare Other

## 2019-08-06 ENCOUNTER — Encounter: Payer: Self-pay | Admitting: Internal Medicine

## 2019-08-06 ENCOUNTER — Ambulatory Visit (INDEPENDENT_AMBULATORY_CARE_PROVIDER_SITE_OTHER): Payer: Medicare Other | Admitting: Internal Medicine

## 2019-08-06 DIAGNOSIS — R918 Other nonspecific abnormal finding of lung field: Secondary | ICD-10-CM

## 2019-08-06 DIAGNOSIS — J449 Chronic obstructive pulmonary disease, unspecified: Secondary | ICD-10-CM | POA: Diagnosis not present

## 2019-08-06 NOTE — Patient Instructions (Addendum)
If not using advair or spiriva no reason to leave on the [facility]  Late add: turns out using both p discussion with nurses so no change rx    Please schedule a follow up visit in 3 months but call sooner if needed with cxr here on return

## 2019-08-06 NOTE — Progress Notes (Signed)
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 Lianne Moris but he cannot recall any operation or who has followed him for this but records in care everywhere indicate the nodules have been followed at [facility] and past PET's show borderline uptake      History of Present Illness  04/20/2018  Pulmonary/ 1st office eval/[place]  Chief Complaint  Patient presents with  . Pulmonary Consult    Referred by Dr. Virgel Bouquet for eval of lung nodule.    Dyspnea:  sev hundred feet slow pace = MMRC3 = can't walk 100 yards even at a slow pace at a flat grade s stopping due to sob   Cough: none Sleep: sleeps ok / able to lie flat ok  SABA use: not clear which ones he's using  / very confused with details of care rec Based on the severity of your copd there is no easy way to make a diagnosis or treat the lung nodules at this point so best to just watch them and if it looks like any are growing larger than others and they are bothering you we could consider then some form of biopsy.   08/06/2019  f/u ov/[place] Martin re: copd/ mpns Chief Complaint  Patient presents with  . Follow-up    SOB with activity.  Dyspnea:  Mostly stays in room cause feels like it, does not recall getting any resp rx though nurses assure me on advair spriva saba  Cough: none  Sleeping: ok  02: none    No obvious day to day or daytime variability or assoc excess/ purulent sputum or mucus plugs or hemoptysis or cp or chest tightness, subjective wheeze or overt sinus or hb symptoms.   Sleeping  without nocturnal  or early am exacerbation  of respiratory  c/o's or  need for noct saba. Also denies any obvious fluctuation of symptoms with weather or environmental changes or other aggravating or alleviating factors except as outlined above   No unusual exposure hx or h/o childhood pna/ asthma or knowledge of premature birth.  Current Allergies, Complete Past Medical History, Past Surgical History, Family History, and Social History were reviewed in [employer] record.  ROS  The following are not active complaints unless bolded Hoarseness, sore throat, dysphagia, dental problems, itching, sneezing,  nasal congestion or discharge of excess mucus or purulent secretions, ear ache,   fever, chills, sweats, unintended wt loss or wt gain, classically pleuritic or exertional cp,  orthopnea pnd or arm/hand swelling  or leg swelling, presyncope, palpitations, abdominal pain, anorexia, nausea, vomiting, diarrhea  or change in bowel habits or change in bladder habits, change in stools or change in urine, dysuria, hematuria,  rash, arthralgias, visual complaints, headache, numbness, weakness or ataxia or problems with walking or coordination,  change in mood or  Memory getting worse per nursing         Current Meds  Medication Sig  . acetaminophen (TYLENOL) 325 MG tablet Take 650 mg by mouth every 6 (six) hours as needed.  Marland Kitchen  albuterol (PROVENTIL HFA;VENTOLIN HFA) 108 (90 Base) MCG/ACT inhaler Inhale 2 puffs into the lungs every 6 (six) hours as needed for wheezing or shortness of breath.  . ALPRAZolam (XANAX) 1 MG tablet Take 1 mg by mouth at bedtime.  Marland Kitchen alum & mag hydroxide-simeth (MAALOX PLUS) 400-400-40 MG/5ML suspension Take 30 mLs by mouth 4 (four) times daily as needed for indigestion.  Marland Kitchen amLODipine (NORVASC) 5 MG tablet Take 5 mg by mouth daily.   Marland Kitchen amoxicillin-clavulanate (AUGMENTIN) 875-125 MG tablet Take 1 tablet by mouth 2 (two) times daily.  Marland Kitchen atorvastatin (LIPITOR) 40 MG tablet Take 40 mg by mouth daily.  . benztropine (COGENTIN) 1 MG  tablet Take 1 mg by mouth 2 (two) times daily.  . bisacodyl (DULCOLAX) 5 MG EC tablet Take 10 mg by mouth daily as needed for moderate constipation.  . busPIRone (BUSPAR) 10 MG tablet Take 10 mg by mouth See admin instructions. Take 1 tablet by mouth twice daily scheduled and up to 3 additional times as needed for anxiety  . dextromethorphan-guaiFENesin (MUCINEX DM) 30-600 MG 12hr tablet Take 1 tablet by mouth at bedtime.  . Fluticasone-Salmeterol (ADVAIR) 100-50 MCG/DOSE AEPB Inhale 1 puff into the lungs 2 (two) times daily.  Marland Kitchen glipiZIDE (GLUCOTROL) 10 MG tablet Take 10 mg by mouth 2 (two) times daily before a meal.   . ipratropium-albuterol (DUONEB) 0.5-2.5 (3) MG/3ML SOLN 1 vial in neb every 8 hours as needed  . losartan (COZAAR) 50 MG tablet Take 50 mg by mouth daily.  . metFORMIN (GLUCOPHAGE) 1000 MG tablet Take 1,000 mg by mouth 2 (two) times daily.  . metoprolol tartrate (LOPRESSOR) 25 MG tablet Take 25 mg by mouth 2 (two) times daily.  Marland Kitchen OLANZapine (ZYPREXA) 10 MG tablet Take 10 mg by mouth at bedtime.  Marland Kitchen oxycodone (OXY-IR) 5 MG capsule Take 5 mg by mouth as needed for pain.   . pantoprazole (PROTONIX) 40 MG tablet Take 40 mg by mouth daily.  . pregabalin (LYRICA) 25 MG capsule Take 25 mg by mouth 2 (two) times daily.  . sertraline (ZOLOFT) 50 MG tablet Take 50 mg by mouth daily.  . simethicone (MYLICON) 80 MG chewable tablet Chew 80 mg by mouth every 6 (six) hours as needed for flatulence.  . tiotropium (SPIRIVA) 18 MCG inhalation capsule Place 18 mcg into inhaler and inhale daily.  . traZODone (DESYREL) 150 MG tablet Take 150 mg by mouth at bedtime.           Objective:      chronically ill w/c bound wm nad   08/06/2019         04/10/17 170 lb (77.1 kg)  10/27/16 167 lb 5.3 oz (75.9 kg)    Knows June  A Monday 2021 / bad heat wave somwwhere    Vital signs reviewed  08/06/2019  - Note at rest 02 sats  98% on RA       HEENT : pt wearing mask not removed for exam due to  covid -19 concerns.    NECK :  without JVD/Nodes/TM/ nl carotid upstrokes bilaterally   LUNGS: no acc muscle use,  Mod barrel  contour chest wall with bilateral  Distant bs s audible wheeze and  without cough on insp or exp maneuvers and mod  Hyperresonant  to  percussion bilaterally     CV:  RRR  no s3 or murmur or increase in P2, and no edema   ABD:  soft and nontender with pos mid insp Hoover's  in the supine position. No bruits or organomegaly appreciated, bowel sounds nl  MS:     ext warm without deformities, calf tenderness, cyanosis or clubbing No obvious joint restrictions   SKIN: warm and dry without lesions    NEURO:  alert, approp, nl sensorium with  no motor or cerebellar deficits apparent.        CXR PA and Lateral:   08/06/2019 :    I personally reviewed images and agree with radiology impression as follows:    1. Interval increase in size of left upper lobe and medial right lower lobe pulmonary nodules, suspicious for either primary pulmonary neoplasm or metastatic disease. Further investigation with PET-CT is advised.        Assessment

## 2019-08-07 NOTE — Assessment & Plan Note (Signed)
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   No change in rx needed

## 2019-11-09 ENCOUNTER — Other Ambulatory Visit: Payer: Self-pay | Admitting: Internal Medicine

## 2019-11-09 DIAGNOSIS — R918 Other nonspecific abnormal finding of lung field: Secondary | ICD-10-CM

## 2019-11-12 ENCOUNTER — Encounter: Payer: Self-pay | Admitting: Internal Medicine

## 2019-11-12 ENCOUNTER — Ambulatory Visit: Payer: Medicaid Other

## 2019-11-12 ENCOUNTER — Other Ambulatory Visit: Payer: Self-pay

## 2019-11-12 ENCOUNTER — Ambulatory Visit (INDEPENDENT_AMBULATORY_CARE_PROVIDER_SITE_OTHER): Payer: Medicare Other | Admitting: Internal Medicine

## 2019-11-12 DIAGNOSIS — J449 Chronic obstructive pulmonary disease, unspecified: Secondary | ICD-10-CM

## 2019-11-12 DIAGNOSIS — R918 Other nonspecific abnormal finding of lung field: Secondary | ICD-10-CM | POA: Diagnosis not present

## 2019-11-12 NOTE — Assessment & Plan Note (Signed)
Stage I adenocarcinoma of the lung with right upper lobectomy and right middle lobe wedge 10/2014 by Dr. Lianne Moris at [place] nodules 05/31/2014  Overview:   Right upper lobe 2.2x 4.1 cm with SUV max 1.5 on 4- 16 PET   Neck nodule 05/31/2014  Overview:   11x18 mm on 4- 16 PET with SUV max of 2.5   cxr 08/06/2019  Increasing nodules/ limited options > rec discuss with POA next step/ faxed to [facility]

## 2019-11-12 NOTE — Patient Instructions (Signed)
I will call Andrew Ward and explain that since Dr Lianne Moris has followed your lung cancer and it appears that it may have recurred you need see him in follow up as soon as possible  Pulmonary follow up here is as needed

## 2019-11-12 NOTE — Progress Notes (Signed)
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 Lianne Moris but he cannot recall any operation or who has followed him for this but records in care everywhere indicate the nodules have been followed at [facility] and past PET's show borderline uptake      History of Present Illness  04/20/2018  Pulmonary/ 1st office eval/Andrew Ward  Chief Complaint  Patient presents with  . Pulmonary Consult    Referred by Dr. Virgel Bouquet for eval of lung nodule.    Dyspnea:  sev hundred feet slow pace = MMRC3 = can't walk 100 yards even at a slow pace at a flat grade s stopping due to sob   Cough: none Sleep: sleeps ok / able to lie flat ok  SABA use: not clear which ones he's using  / very confused with details of care rec Based on the severity of your copd there is no easy way to make a diagnosis or treat the lung nodules at this point so best to just watch them and if it looks like any are growing larger than others and they are bothering you we could consider then some form of biopsy.   08/06/2019  f/u ov/Andrew Ward re: copd/ mpns Chief Complaint  Patient presents with  . Follow-up    SOB with activity.  Dyspnea:  Mostly stays in room cause feels like it, does not recall getting any resp rx though nurses assure me still  on advair spriva saba  Cough: none  Sleeping: ok  02: none  rec discuss with POA re mpns    11/12/2019  f/u ov/Andrew Ward re:  COPD/ mpn's - no cigs x several months / Audelia Acton is son at [phone] Chief Complaint  Patient presents with  . Follow-up   Dyspnea:  Stumbles everywhere so just stays in w/c all day  Cough: none  no cp  Sleeping: ok almost flat/ one pillow  SABA use: not aware he's getting any  02: none    No obvious day to day or daytime variability or assoc excess/ purulent sputum or mucus plugs or hemoptysis or cp or chest tightness, subjective wheeze or overt sinus or hb symptoms.   Sleeping  without nocturnal  or early am exacerbation  of respiratory  c/o's or need for noct saba. Also denies any obvious fluctuation of symptoms with weather or environmental changes or other aggravating or alleviating factors except as outlined above   No unusual exposure hx or h/o childhood pna/ asthma or knowledge of premature birth.  Current Allergies, Complete Past Medical History, Past Surgical History, Family History, and Social History were reviewed in [employer] record.  ROS  The following are not active complaints unless bolded Hoarseness, sore throat, dysphagia, dental problems, itching, sneezing,  nasal congestion or discharge of excess mucus or purulent secretions, ear ache,   fever, chills, sweats, unintended wt loss or wt gain, classically pleuritic or exertional cp,  orthopnea pnd or arm/hand swelling  or leg swelling, presyncope, palpitations, abdominal pain, anorexia, nausea, vomiting, diarrhea  or change  in bowel habits or change in bladder habits, change in stools or change in urine, dysuria, hematuria,  rash, arthralgias, visual complaints, headache, numbness, weakness or ataxia or problems with walking or coordination,  change in mood or  memory.        Current Meds  Medication Sig  . acetaminophen (TYLENOL) 325 MG tablet Take 650 mg by mouth every 6 (six) hours as needed.  Marland Kitchen albuterol (PROVENTIL HFA;VENTOLIN HFA) 108 (90 Base) MCG/ACT inhaler Inhale 2 puffs into the lungs every 6 (six) hours as needed for wheezing or shortness of breath.  . ALPRAZolam (XANAX) 1 MG tablet Take 1 mg by mouth at bedtime.  Marland Kitchen alum & mag hydroxide-simeth (MAALOX PLUS) 400-400-40 MG/5ML  suspension Take 30 mLs by mouth 4 (four) times daily as needed for indigestion.  Marland Kitchen amLODipine (NORVASC) 5 MG tablet Take 5 mg by mouth daily.   Marland Kitchen amoxicillin-clavulanate (AUGMENTIN) 875-125 MG tablet Take 1 tablet by mouth 2 (two) times daily.  Marland Kitchen atorvastatin (LIPITOR) 40 MG tablet Take 40 mg by mouth daily.  . benztropine (COGENTIN) 1 MG tablet Take 1 mg by mouth 2 (two) times daily.  . bisacodyl (DULCOLAX) 5 MG EC tablet Take 10 mg by mouth daily as needed for moderate constipation.  . busPIRone (BUSPAR) 10 MG tablet Take 10 mg by mouth See admin instructions. Take 1 tablet by mouth twice daily scheduled and up to 3 additional times as needed for anxiety  . dextromethorphan-guaiFENesin (MUCINEX DM) 30-600 MG 12hr tablet Take 1 tablet by mouth at bedtime.  . Fluticasone-Salmeterol (ADVAIR) 100-50 MCG/DOSE AEPB Inhale 1 puff into the lungs 2 (two) times daily.  Marland Kitchen glipiZIDE (GLUCOTROL) 10 MG tablet Take 10 mg by mouth 2 (two) times daily before a meal.   . ipratropium-albuterol (DUONEB) 0.5-2.5 (3) MG/3ML SOLN 1 vial in neb every 8 hours as needed  . losartan (COZAAR) 50 MG tablet Take 50 mg by mouth daily.  . metFORMIN (GLUCOPHAGE) 1000 MG tablet Take 1,000 mg by mouth 2 (two) times daily.  . metoprolol tartrate (LOPRESSOR) 25 MG tablet Take 25 mg by mouth 2 (two) times daily.  Marland Kitchen OLANZapine (ZYPREXA) 10 MG tablet Take 10 mg by mouth at bedtime.  Marland Kitchen oxycodone (OXY-IR) 5 MG capsule Take 5 mg by mouth as needed for pain.   . pantoprazole (PROTONIX) 40 MG tablet Take 40 mg by mouth daily.  . pregabalin (LYRICA) 25 MG capsule Take 25 mg by mouth 2 (two) times daily.  . sertraline (ZOLOFT) 50 MG tablet Take 50 mg by mouth daily.  . simethicone (MYLICON) 80 MG chewable tablet Chew 80 mg by mouth every 6 (six) hours as needed for flatulence.  . tiotropium (SPIRIVA) 18 MCG inhalation capsule Place 18 mcg into inhaler and inhale daily.  . traZODone (DESYREL) 150 MG tablet Take 150 mg by mouth at bedtime.            Objective:      w/c bound wm doesn't know why he's here or how to get in touch with son    11/12/2019       172   04/10/17 170 lb (77.1 kg)  10/27/16 167 lb 5.3 oz (75.9 kg)    Vital signs reviewed  11/12/2019  - Note at rest 02 sats  97% on RA     HEENT : pt wearing mask not removed for exam due to covid -19 concerns.    NECK :  without JVD/Nodes/TM/ nl carotid upstrokes bilaterally   LUNGS:  no acc muscle use,  Mod barrel  contour chest wall with bilateral  Distant bs s audible wheeze and  without cough on insp or exp maneuvers and mod  Hyperresonant  to  percussion bilaterally     CV:  RRR  no s3 or murmur or increase in P2, and no edema   ABD:  soft and nontender with pos mid insp Hoover's  in the supine position. No bruits or organomegaly appreciated, bowel sounds nl  MS:     ext warm without deformities, calf tenderness, cyanosis or clubbing No obvious joint restrictions   SKIN: warm and dry without lesions    NEURO:  Alert   no motor  deficits apparent/ very poor recall                 Assessment

## 2019-11-13 ENCOUNTER — Encounter: Payer: Self-pay | Admitting: Internal Medicine

## 2019-11-13 NOTE — Assessment & Plan Note (Addendum)
Still actively smoking "we he can" - Spirometry 04/20/2018  FEV1 0.9 (27%)  Ratio 0.30  -04/20/2018   Walked RA  2 laps @  approx 250ft each @ slow pace  stopped due to  End of study, nl sats/ denied sob    Group D in terms of symptom/risk and laba/lama/ICS  therefore appropriate rx at this point >>>  Continue advai/ spiriva and prn albuterol     Limited now by balance, not sob  ?  Met cns dz ?  > w/u to be completed at [facility] unless otherwise requested locally and f/u in pulmonary clinic prn.   Total time for visit/ charting/ arranging approp f/u = 20 min    

## 2019-11-13 NOTE — Assessment & Plan Note (Signed)
Stage I adenocarcinoma of the lung with right upper lobectomy and right middle lobe wedge 10/2014 by Dr. Lianne Moris at [place] nodules 05/31/2014  Overview:   Right upper lobe 2.2x 4.1 cm with SUV max 1.5 on 4- 16 PET   Neck nodule 05/31/2014  Overview:   11x18 mm on 4- 16 PET with SUV max of 2.5   cxr 08/06/2019  Increasing nodules/ limited options > rec discuss with POA next step/ faxed to [facility]   Not clear what the f/u plan was by the [facility] notes I reviewed today but clearly needs a repeat PET scan if going to be aggressive and will need likely also a tissue dx as directed by the PET.  I attempted to contact his son Audelia Acton but unable to do so so directed his nurse to refer back to [facility] and have his son call me so we can be sure we're all on on the same page as to the limited options for treatment that can reasonably be expected to improve is survival/ QOL going forward.

## 2020-05-21 ENCOUNTER — Other Ambulatory Visit: Payer: Self-pay

## 2020-05-21 ENCOUNTER — Encounter (HOSPITAL_COMMUNITY): Payer: Self-pay | Admitting: *Deleted

## 2020-05-21 ENCOUNTER — Observation Stay (HOSPITAL_COMMUNITY): Payer: Medicare (Managed Care)

## 2020-05-21 ENCOUNTER — Inpatient Hospital Stay (HOSPITAL_COMMUNITY)
Admission: EM | Admit: 2020-05-21 | Discharge: 2020-05-26 | DRG: 809 | Disposition: A | Discharge status: Skilled Nursing Facility | Payer: Medicare (Managed Care) | Source: Skilled Nursing Facility | Attending: Internal Medicine | Admitting: Internal Medicine

## 2020-05-21 DIAGNOSIS — Z20822 Contact with and (suspected) exposure to covid-19: Secondary | ICD-10-CM | POA: Diagnosis present

## 2020-05-21 DIAGNOSIS — F32A Depression, unspecified: Secondary | ICD-10-CM | POA: Diagnosis present

## 2020-05-21 DIAGNOSIS — C3490 Malignant neoplasm of unspecified part of unspecified bronchus or lung: Secondary | ICD-10-CM | POA: Diagnosis present

## 2020-05-21 DIAGNOSIS — C3491 Malignant neoplasm of unspecified part of right bronchus or lung: Secondary | ICD-10-CM | POA: Diagnosis not present

## 2020-05-21 DIAGNOSIS — C7800 Secondary malignant neoplasm of unspecified lung: Secondary | ICD-10-CM | POA: Diagnosis present

## 2020-05-21 DIAGNOSIS — J449 Chronic obstructive pulmonary disease, unspecified: Secondary | ICD-10-CM | POA: Diagnosis not present

## 2020-05-21 DIAGNOSIS — Z85118 Personal history of other malignant neoplasm of bronchus and lung: Secondary | ICD-10-CM

## 2020-05-21 DIAGNOSIS — J189 Pneumonia, unspecified organism: Secondary | ICD-10-CM

## 2020-05-21 DIAGNOSIS — D702 Other drug-induced agranulocytosis: Secondary | ICD-10-CM

## 2020-05-21 DIAGNOSIS — D701 Agranulocytosis secondary to cancer chemotherapy: Secondary | ICD-10-CM | POA: Diagnosis present

## 2020-05-21 DIAGNOSIS — Z7951 Long term (current) use of inhaled steroids: Secondary | ICD-10-CM

## 2020-05-21 DIAGNOSIS — K7469 Other cirrhosis of liver: Secondary | ICD-10-CM

## 2020-05-21 DIAGNOSIS — D61818 Other pancytopenia: Secondary | ICD-10-CM

## 2020-05-21 DIAGNOSIS — Z79899 Other long term (current) drug therapy: Secondary | ICD-10-CM

## 2020-05-21 DIAGNOSIS — E1165 Type 2 diabetes mellitus with hyperglycemia: Secondary | ICD-10-CM | POA: Diagnosis present

## 2020-05-21 DIAGNOSIS — I10 Essential (primary) hypertension: Secondary | ICD-10-CM

## 2020-05-21 DIAGNOSIS — F419 Anxiety disorder, unspecified: Secondary | ICD-10-CM | POA: Diagnosis present

## 2020-05-21 DIAGNOSIS — K7460 Unspecified cirrhosis of liver: Secondary | ICD-10-CM | POA: Diagnosis present

## 2020-05-21 DIAGNOSIS — E876 Hypokalemia: Secondary | ICD-10-CM | POA: Diagnosis present

## 2020-05-21 DIAGNOSIS — B1920 Unspecified viral hepatitis C without hepatic coma: Secondary | ICD-10-CM | POA: Diagnosis present

## 2020-05-21 DIAGNOSIS — T451X5A Adverse effect of antineoplastic and immunosuppressive drugs, initial encounter: Secondary | ICD-10-CM | POA: Diagnosis present

## 2020-05-21 DIAGNOSIS — D61810 Antineoplastic chemotherapy induced pancytopenia: Principal | ICD-10-CM | POA: Diagnosis present

## 2020-05-21 DIAGNOSIS — Z87891 Personal history of nicotine dependence: Secondary | ICD-10-CM

## 2020-05-21 DIAGNOSIS — Z888 Allergy status to other drugs, medicaments and biological substances status: Secondary | ICD-10-CM

## 2020-05-21 DIAGNOSIS — E782 Mixed hyperlipidemia: Secondary | ICD-10-CM

## 2020-05-21 DIAGNOSIS — E1169 Type 2 diabetes mellitus with other specified complication: Secondary | ICD-10-CM | POA: Diagnosis present

## 2020-05-21 DIAGNOSIS — Z794 Long term (current) use of insulin: Secondary | ICD-10-CM

## 2020-05-21 DIAGNOSIS — Z902 Acquired absence of lung [part of]: Secondary | ICD-10-CM

## 2020-05-21 DIAGNOSIS — F0390 Unspecified dementia without behavioral disturbance: Secondary | ICD-10-CM

## 2020-05-21 DIAGNOSIS — E785 Hyperlipidemia, unspecified: Secondary | ICD-10-CM | POA: Diagnosis present

## 2020-05-21 LAB — RESP PANEL BY RT-PCR (FLU A&B, COVID) ARPGX2
Influenza A by PCR: NEGATIVE
Influenza B by PCR: NEGATIVE
SARS Coronavirus 2 by RT PCR: NEGATIVE

## 2020-05-21 LAB — PHOSPHORUS: Phosphorus: 2.5 mg/dL (ref 2.5–4.6)

## 2020-05-21 LAB — COMPREHENSIVE METABOLIC PANEL
ALT: 31 U/L (ref 0–44)
AST: 28 U/L (ref 15–41)
Albumin: 3.6 g/dL (ref 3.5–5.0)
Alkaline Phosphatase: 59 U/L (ref 38–126)
Anion gap: 6 (ref 5–15)
BUN: 17 mg/dL (ref 8–23)
CO2: 22 mmol/L (ref 22–32)
Calcium: 8.8 mg/dL — ABNORMAL LOW (ref 8.9–10.3)
Chloride: 109 mmol/L (ref 98–111)
Creatinine, Ser: 0.84 mg/dL (ref 0.61–1.24)
GFR, Estimated: >60 mL/min (ref >60)
Glucose, Bld: 210 mg/dL — ABNORMAL HIGH (ref 70–99)
Potassium: 4.5 mmol/L (ref 3.5–5.1)
Sodium: 137 mmol/L (ref 135–145)
Total Bilirubin: 0.7 mg/dL (ref 0.3–1.2)
Total Protein: 6.6 g/dL (ref 6.5–8.1)

## 2020-05-21 LAB — CBC WITH DIFFERENTIAL/PLATELET
Abs Immature Granulocytes: 0.01 10*3/uL (ref 0.00–0.07)
Basophils Absolute: 0 10*3/uL (ref 0.0–0.1)
Basophils Relative: 0 %
Eosinophils Absolute: 0.1 10*3/uL (ref 0.0–0.5)
Eosinophils Relative: 4 %
HCT: 19.7 % — ABNORMAL LOW (ref 39.0–52.0)
Hemoglobin: 6.5 g/dL — CL (ref 13.0–17.0)
Immature Granulocytes: 1 %
Lymphocytes Relative: 57 %
Lymphs Abs: 1.2 10*3/uL (ref 0.7–4.0)
MCH: 30 pg (ref 26.0–34.0)
MCHC: 33 g/dL (ref 30.0–36.0)
MCV: 90.8 fL (ref 80.0–100.0)
Monocytes Absolute: 0.2 10*3/uL (ref 0.1–1.0)
Monocytes Relative: 8 %
Neutro Abs: 0.7 10*3/uL — ABNORMAL LOW (ref 1.7–7.7)
Neutrophils Relative %: 30 %
Platelets: 21 10*3/uL — CL (ref 150–400)
RBC: 2.17 MIL/uL — ABNORMAL LOW (ref 4.22–5.81)
RDW: 15 % (ref 11.5–15.5)
WBC: 2.1 10*3/uL — ABNORMAL LOW (ref 4.0–10.5)
nRBC: 0 % (ref 0.0–0.2)

## 2020-05-21 LAB — RETICULOCYTES
Immature Retic Fract: 11.4 % (ref 2.3–15.9)
RBC.: 2.19 MIL/uL — ABNORMAL LOW (ref 4.22–5.81)
Retic Count, Absolute: 8.6 10*3/uL — ABNORMAL LOW (ref 19.0–186.0)
Retic Ct Pct: 0.4 % (ref 0.4–3.1)

## 2020-05-21 LAB — ABO/RH: ABO/RH(D): A NEG

## 2020-05-21 LAB — MAGNESIUM: Magnesium: 1.4 mg/dL — ABNORMAL LOW (ref 1.7–2.4)

## 2020-05-21 LAB — PREPARE RBC (CROSSMATCH)

## 2020-05-21 LAB — GLUCOSE, CAPILLARY: Glucose-Capillary: 164 mg/dL — ABNORMAL HIGH (ref 70–99)

## 2020-05-21 MED ORDER — UMECLIDINIUM BROMIDE 62.5 MCG/INH IN AEPB
1.0000 | INHALATION_SPRAY | Freq: Every day | RESPIRATORY_TRACT | Status: DC
  Administered 2020-05-24 – 2020-05-26 (×3): 1 via RESPIRATORY_TRACT
  Filled 2020-05-21: qty 7

## 2020-05-21 MED ORDER — POLYETHYLENE GLYCOL 3350 17 G PO PACK: 17.0000 g | PACK | Freq: Every day | ORAL | Status: DC | PRN

## 2020-05-21 MED ORDER — PREGABALIN 50 MG PO CAPS
50.0000 mg | ORAL_CAPSULE | Freq: Every day | ORAL | Status: DC
  Administered 2020-05-21 – 2020-05-25 (×5): 50 mg via ORAL
  Filled 2020-05-21 (×5): qty 1

## 2020-05-21 MED ORDER — ACETAMINOPHEN 325 MG PO TABS
650.0000 mg | ORAL_TABLET | Freq: Four times a day (QID) | ORAL | Status: DC | PRN
  Administered 2020-05-21 – 2020-05-25 (×3): 650 mg via ORAL
  Filled 2020-05-21 (×4): qty 2

## 2020-05-21 MED ORDER — INSULIN DETEMIR 100 UNIT/ML ~~LOC~~ SOLN
12.0000 [IU] | Freq: Every day | SUBCUTANEOUS | Status: DC
  Administered 2020-05-21 – 2020-05-25 (×5): 12 [IU] via SUBCUTANEOUS
  Filled 2020-05-21 (×6): qty 0.12

## 2020-05-21 MED ORDER — OLANZAPINE 5 MG PO TABS
5.0000 mg | ORAL_TABLET | Freq: Every day | ORAL | Status: DC
  Administered 2020-05-21 – 2020-05-25 (×5): 5 mg via ORAL
  Filled 2020-05-21 (×5): qty 1

## 2020-05-21 MED ORDER — ATORVASTATIN CALCIUM 40 MG PO TABS
40.0000 mg | ORAL_TABLET | Freq: Every day | ORAL | Status: DC
  Administered 2020-05-21 – 2020-05-25 (×5): 40 mg via ORAL
  Filled 2020-05-21 (×5): qty 1

## 2020-05-21 MED ORDER — ONDANSETRON HCL 4 MG/2ML IJ SOLN: 4.0000 mg | Freq: Four times a day (QID) | INTRAMUSCULAR | Status: DC | PRN

## 2020-05-21 MED ORDER — TRAZODONE HCL 50 MG PO TABS
200.0000 mg | ORAL_TABLET | Freq: Every day | ORAL | Status: DC
  Administered 2020-05-21 – 2020-05-25 (×5): 200 mg via ORAL
  Filled 2020-05-21 (×7): qty 4

## 2020-05-21 MED ORDER — IPRATROPIUM-ALBUTEROL 0.5-2.5 (3) MG/3ML IN SOLN: 3.0000 mL | RESPIRATORY_TRACT | Status: DC | PRN

## 2020-05-21 MED ORDER — ALBUTEROL SULFATE HFA 108 (90 BASE) MCG/ACT IN AERS
2.0000 | INHALATION_SPRAY | RESPIRATORY_TRACT | Status: DC | PRN
  Filled 2020-05-21: qty 6.7

## 2020-05-21 MED ORDER — LISINOPRIL 5 MG PO TABS
5.0000 mg | ORAL_TABLET | Freq: Every day | ORAL | Status: DC
  Administered 2020-05-22 – 2020-05-26 (×5): 5 mg via ORAL
  Filled 2020-05-21 (×5): qty 1

## 2020-05-21 MED ORDER — SODIUM CHLORIDE 0.9 % IV SOLN: 10.0000 mL/h | Freq: Once | INTRAVENOUS | Status: DC

## 2020-05-21 MED ORDER — INSULIN ASPART 100 UNIT/ML ~~LOC~~ SOLN
0.0000 [IU] | Freq: Three times a day (TID) | SUBCUTANEOUS | Status: DC
Start: 2020-05-21 — End: 2020-05-26
  Administered 2020-05-22 (×2): 2 [IU] via SUBCUTANEOUS
  Administered 2020-05-22: 3 [IU] via SUBCUTANEOUS
  Administered 2020-05-22: 2 [IU] via SUBCUTANEOUS
  Administered 2020-05-23: 3 [IU] via SUBCUTANEOUS
  Administered 2020-05-23: 2 [IU] via SUBCUTANEOUS
  Administered 2020-05-23: 3 [IU] via SUBCUTANEOUS
  Administered 2020-05-23 – 2020-05-24 (×2): 2 [IU] via SUBCUTANEOUS
  Administered 2020-05-24 (×3): 3 [IU] via SUBCUTANEOUS
  Administered 2020-05-25: 5 [IU] via SUBCUTANEOUS
  Administered 2020-05-25 (×2): 3 [IU] via SUBCUTANEOUS
  Administered 2020-05-26: 2 [IU] via SUBCUTANEOUS
  Administered 2020-05-26: 3 [IU] via SUBCUTANEOUS
  Filled 2020-05-21: qty 0.15

## 2020-05-21 MED ORDER — PREGABALIN 25 MG PO CAPS
25.0000 mg | ORAL_CAPSULE | Freq: Every day | ORAL | Status: DC
  Administered 2020-05-22 – 2020-05-26 (×5): 25 mg via ORAL
  Filled 2020-05-21 (×5): qty 1

## 2020-05-21 MED ORDER — MAGNESIUM SULFATE 4 GM/100ML IV SOLN
4.0000 g | Freq: Once | INTRAVENOUS | Status: AC
  Administered 2020-05-21: 4 g via INTRAVENOUS
  Filled 2020-05-21: qty 100

## 2020-05-21 MED ORDER — FOLIC ACID 1 MG PO TABS
1.0000 mg | ORAL_TABLET | Freq: Every day | ORAL | Status: DC
  Administered 2020-05-22 – 2020-05-26 (×5): 1 mg via ORAL
  Filled 2020-05-21 (×5): qty 1

## 2020-05-21 MED ORDER — ACETAMINOPHEN 650 MG RE SUPP: 650.0000 mg | Freq: Four times a day (QID) | RECTAL | Status: DC | PRN

## 2020-05-21 MED ORDER — MELATONIN 3 MG PO TABS
3.0000 mg | ORAL_TABLET | Freq: Every day | ORAL | Status: DC
  Administered 2020-05-21 – 2020-05-25 (×5): 3 mg via ORAL
  Filled 2020-05-21 (×5): qty 1

## 2020-05-21 MED ORDER — TIOTROPIUM BROMIDE MONOHYDRATE 18 MCG IN CAPS: 18.0000 ug | ORAL_CAPSULE | Freq: Every day | RESPIRATORY_TRACT | Status: DC

## 2020-05-21 MED ORDER — BUSPIRONE HCL 5 MG PO TABS
15.0000 mg | ORAL_TABLET | Freq: Three times a day (TID) | ORAL | Status: DC
  Administered 2020-05-21 – 2020-05-26 (×14): 15 mg via ORAL
  Filled 2020-05-21 (×14): qty 3

## 2020-05-21 MED ORDER — ALPRAZOLAM 1 MG PO TABS
1.0000 mg | ORAL_TABLET | Freq: Every day | ORAL | Status: DC
  Administered 2020-05-21 – 2020-05-25 (×5): 1 mg via ORAL
  Filled 2020-05-21 (×4): qty 1
  Filled 2020-05-21: qty 2

## 2020-05-21 MED ORDER — FLUTICASONE FUROATE-VILANTEROL 200-25 MCG/INH IN AEPB
1.0000 | INHALATION_SPRAY | Freq: Every day | RESPIRATORY_TRACT | Status: DC
  Administered 2020-05-24 – 2020-05-26 (×3): 1 via RESPIRATORY_TRACT
  Filled 2020-05-21: qty 28

## 2020-05-21 MED ORDER — METOPROLOL SUCCINATE ER 50 MG PO TB24
50.0000 mg | ORAL_TABLET | Freq: Every day | ORAL | Status: DC
  Administered 2020-05-22 – 2020-05-26 (×5): 50 mg via ORAL
  Filled 2020-05-21 (×5): qty 1

## 2020-05-21 MED ORDER — SODIUM CHLORIDE 0.9 % IV BOLUS
1000.0000 mL | Freq: Once | INTRAVENOUS | Status: AC
  Administered 2020-05-21: 1000 mL via INTRAVENOUS

## 2020-05-21 MED ORDER — SERTRALINE HCL 50 MG PO TABS
150.0000 mg | ORAL_TABLET | Freq: Every day | ORAL | Status: DC
  Administered 2020-05-22 – 2020-05-26 (×5): 150 mg via ORAL
  Filled 2020-05-21 (×5): qty 1

## 2020-05-21 MED ORDER — ONDANSETRON HCL 4 MG PO TABS: 4.0000 mg | ORAL_TABLET | Freq: Four times a day (QID) | ORAL | Status: DC | PRN

## 2020-05-21 MED ORDER — LIDOCAINE 5 % EX PTCH
1.0000 | MEDICATED_PATCH | CUTANEOUS | Status: DC
  Administered 2020-05-21 – 2020-05-25 (×5): 1 via TRANSDERMAL
  Filled 2020-05-21 (×6): qty 1

## 2020-05-21 NOTE — ED Notes (Signed)
Provider at bedside to evaluate and consent signed for receipt of blood transfusion.

## 2020-05-21 NOTE — ED Provider Notes (Signed)
[facility] DEPT Provider Note   CSN: [id number] Arrival date & time: 05/21/20  1629     History Chief Complaint  Patient presents with  . Hemoglobin    Hgb 6.5, platelets 24, wbc 1.9.     Andrew Ward is a 75 y.o. male.  HPI     75 year old male comes in w/ cc of low Hb. Patient has history of diabetes, COPD, liver cirrhosis and dementia.  He has history of right sided lung cancer.  Patient resides at a skilled nursing facility.  He was informed that his hemoglobin was low, and advised to come to the ER.  Patient reports that he just received his first dose of chemotherapy on 3-25.   Patient receives his cancer care at outside facility.  On care everywhere it appears that patient was started on combination of carboplatinum, Keytruda, Alimta on 03-21-20.  Patient denies any bloody stools.  He reports baseline shortness of breath and weakness.  No recent near fainting spells.  Past Medical History:  Diagnosis Date  . Anxiety   . COPD (chronic obstructive pulmonary disease) ([place])   . Dementia ([place])   . Depression   . Diabetes mellitus without complication ([place])   . Hypertension     Patient Active Problem List   Diagnosis Date Noted  . Pancytopenia due to antineoplastic chemotherapy ([place]) 05/21/2020  . Multiple pulmonary nodules determined by computed tomography of lung 04/21/2018  . Lactic acidosis 10/27/2016  . Dementia ([place]) 10/27/2016  . Diabetes mellitus without complication ([place]) 71/69/6789  . COPD ? GOLD IV / poor f/v loop 10/27/2016  . Hypertension 10/27/2016  . Bipolar disorder ([place]) 10/27/2016    History reviewed. No pertinent surgical history.     History reviewed. No pertinent family history.  Social History   Tobacco Use  . Smoking status: Former Smoker    Packs/day: 2.00    Years: 50.00    Pack years: 100.00    Types: Cigarettes  . Smokeless tobacco: Never Used  Vaping Use  . Vaping Use: Never used   Substance Use Topics  . Alcohol use: No  . Drug use: No    Home Medications Prior to Admission medications   Medication Sig Start Date End Date Taking? Authorizing Provider  acetaminophen (TYLENOL) 325 MG tablet Take 650 mg by mouth every 6 (six) hours as needed for moderate pain.   Yes [provider]  acetaminophen (TYLENOL) 500 MG tablet Take 1,000 mg by mouth in the morning and at bedtime.   Yes [provider]  ADVAIR DISKUS 250-50 MCG/DOSE AEPB Inhale 1 puff into the lungs 2 (two) times daily. 03/19/20  Yes [provider]  albuterol (PROVENTIL HFA;VENTOLIN HFA) 108 (90 Base) MCG/ACT inhaler Inhale 2 puffs into the lungs daily in the afternoon.   Yes [provider]  ALPRAZolam Duanne Moron) 1 MG tablet Take 1 mg by mouth at bedtime.   Yes [provider]  antiseptic oral rinse (BIOTENE) LIQD 10 mLs by Mouth Rinse route in the morning and at bedtime.   Yes [provider]  atorvastatin (LIPITOR) 40 MG tablet Take 40 mg by mouth at bedtime.   Yes [provider]  B Complex-C (B-COMPLEX WITH VITAMIN C) tablet Take 1 tablet by mouth daily.   Yes [provider]  bisacodyl (DULCOLAX) 5 MG EC tablet Take 10 mg by mouth daily as needed for moderate constipation.   Yes [provider]  busPIRone (BUSPAR) 15 MG tablet Take 15  mg by mouth 3 (three) times daily. 05/01/20  Yes [provider]  folic acid (FOLVITE) 1 MG tablet Take 1 mg by mouth daily. 04/21/20  Yes [provider]  insulin aspart (NOVOLOG) 100 UNIT/ML injection Inject 0-14 Units into the skin as directed. If BS is 70-200=0 units 201-250=2 units 251-300=4 units 301-350=6 units 351-400=8 units 401-450=10 units 451-500=12 units BS>500 or "HIGH"=14 units; Re-check BS in 2 Hours. If BS>400 or <80, Call [place] Triage   Yes [provider]  ipratropium-albuterol (DUONEB) 0.5-2.5 (3) MG/3ML SOLN Inhale 3 mLs into the lungs every 6 (six)  hours as needed (sob/wheezing). 04/17/18  Yes [provider]  LEVEMIR 100 UNIT/ML injection Inject 12 Units into the skin at bedtime. 03/19/20  Yes [provider]  Lidocaine 4 % PTCH Apply 1 patch topically in the morning and at bedtime.   Yes [provider]  lisinopril (ZESTRIL) 5 MG tablet Take 5 mg by mouth daily. 05/16/20  Yes [provider]  melatonin 3 MG TABS tablet Take 3 mg by mouth at bedtime.   Yes [provider]  metFORMIN (GLUCOPHAGE) 1000 MG tablet Take 1,000 mg by mouth 2 (two) times daily. 03/28/16  Yes [provider]  metoprolol succinate (TOPROL-XL) 50 MG 24 hr tablet Take 50 mg by mouth daily. 05/01/20  Yes [provider]  OLANZapine (ZYPREXA) 5 MG tablet Take 5 mg by mouth at bedtime. 05/01/20  Yes [provider]  ondansetron (ZOFRAN-ODT) 4 MG disintegrating tablet Take 4 mg by mouth every 8 (eight) hours as needed for nausea or vomiting. 04/07/20  Yes [provider]  pregabalin (LYRICA) 25 MG capsule Take 25 mg by mouth daily.   Yes [provider]  pregabalin (LYRICA) 50 MG capsule Take 50 mg by mouth at bedtime. 05/06/20  Yes [provider]  sertraline (ZOLOFT) 50 MG tablet Take 150 mg by mouth daily.   Yes [provider]  tiotropium (SPIRIVA) 18 MCG inhalation capsule Place 18 mcg into inhaler and inhale daily.   Yes [provider]  traZODone (DESYREL) 100 MG tablet Take 200 mg by mouth at bedtime. 05/08/20  Yes [provider]  Vitamin D, Ergocalciferol, (DRISDOL) 1.25 MG (50000 UNIT) CAPS capsule Take 50,000 Units by mouth every 30 (thirty) days.   Yes [provider]    Allergies    Fentanyl, Atorvastatin, and Duloxetine  Review of Systems   Review of Systems  Constitutional: Negative for activity change.  Respiratory: Negative for shortness of breath.   Cardiovascular: Negative for chest pain.  Gastrointestinal: Negative for nausea  and vomiting.  Neurological: Negative for dizziness.  Hematological: Does not bruise/bleed easily.  All other systems reviewed and are negative.   Physical Exam Updated Vital Signs BP 110/68   Pulse 79   Temp 98.1 F (36.7 C) (Oral)   Resp 14   Ht 6' (1.829 m)   Wt 78 kg   SpO2 100%   BMI 23.32 kg/m   Physical Exam Vitals and nursing note reviewed.  Constitutional:      Appearance: He is well-developed.  HENT:     Head: Atraumatic.  Cardiovascular:     Rate and Rhythm: Normal rate.  Pulmonary:     Effort: Pulmonary effort is normal.  Musculoskeletal:     Cervical back: Neck supple.  Skin:    General: Skin is warm.  Neurological:     Mental Status: He is alert and oriented to person, place, and time.  ED Results / Procedures / Treatments   Labs (all labs ordered are listed, but only abnormal results are displayed) Labs Reviewed  COMPREHENSIVE METABOLIC PANEL - Abnormal; Notable for the following components:      Result Value   Glucose, Bld 210 (*)    Calcium 8.8 (*)    All other components within normal limits  CBC WITH DIFFERENTIAL/PLATELET - Abnormal; Notable for the following components:   WBC 2.1 (*)    RBC 2.17 (*)    Hemoglobin 6.5 (*)    HCT 19.7 (*)    Platelets 21 (*)    Neutro Abs 0.7 (*)    All other components within normal limits  RETICULOCYTES - Abnormal; Notable for the following components:   RBC. 2.19 (*)    Retic Count, Absolute 8.6 (*)    All other components within normal limits  MAGNESIUM - Abnormal; Notable for the following components:   Magnesium 1.4 (*)    All other components within normal limits  RESP PANEL BY RT-PCR (FLU A&B, COVID) ARPGX2  PHOSPHORUS  URINALYSIS, COMPLETE (UACMP) WITH MICROSCOPIC  HEMOGLOBIN A1C  TYPE AND SCREEN  ABO/RH  PREPARE RBC (CROSSMATCH)    EKG None  Radiology No results found.  Procedures .Critical Care Performed by: Varney Biles, MD Authorized by: Varney Biles, MD    Critical care provider statement:    Critical care time (minutes):  76   Critical care was necessary to treat or prevent imminent or life-threatening deterioration of the following conditions: Pancytopenia with critical anemia and thrombocytopenia.   Critical care was time spent personally by me on the following activities:  Discussions with consultants, evaluation of patient's response to treatment, examination of patient, ordering and performing treatments and interventions, ordering and review of laboratory studies, ordering and review of radiographic studies, pulse oximetry, re-evaluation of patient's condition, obtaining history from patient or surrogate and review of old charts     Medications Ordered in ED Medications  0.9 %  sodium chloride infusion (has no administration in time range)  magnesium sulfate IVPB 4 g 100 mL (has no administration in time range)  sodium chloride 0.9 % bolus 1,000 mL (0 mLs Intravenous Stopped 05/21/20 1927)    ED Course  I have reviewed the triage vital signs and the nursing notes.  Pertinent labs & imaging results that were available during my care of the patient were reviewed by me and considered in my medical decision making (see chart for details).    MDM Rules/Calculators/A&P                          75 year old comes in with chief complaint of abnormal labs.  Patient has known history of right-sided lung cancer and is undergoing chemotherapy.  It appears based on EMS report that patient was noted to have pancytopenia.  Clinically, patient denies new symptoms of dizziness, chest pain, shortness of breath.  He reports he has baseline weakness and shortness of breath.  He has dementia, therefore the history is not completely reliable.  He denies any blood loss.  Plan is to get basic labs and reassess. Differential diagnosis includes aplastic anemia from chemotherapy, blood loss anemia, chemo side effects.  8:28 PM Discussed case with Dr.  Alen Blew, oncology.  He is okay with patient being admitted to [facility] rather than being transferred.  For now he recommends supportive transfusion.  Oncology can be consulted again by medicine team if needed. Pt made aware of  the plan.  Final Clinical Impression(s) / ED Diagnoses Final diagnoses:  Pancytopenia ([place])  Antineoplastic chemotherapy induced pancytopenia (CODE) ([place])    Rx / DC Orders ED Discharge Orders    None       Varney Biles, MD 05/21/20 2028

## 2020-05-21 NOTE — H&P (Signed)
History and Physical    Andrew Ward MOQ:[id number] DOB: [birth year] DOA: 05/21/2020  PCP: [name], DO  Patient coming from: SNF   Chief Complaint:  Chief Complaint  Patient presents with  . Hemoglobin    Hgb 6.5, platelets 24, wbc 1.9.      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  While patient does have a history of dementia he is a good historian and majority of the history has been obtained from him.  Patient explains that he is currently receiving chemotherapy for his adenocarcinoma of the lung.  While patient was initially diagnosed in 2016.  Patient was initially treated with lobectomy at that time but in the past several months has unfortunately developed bilateral pulmonary nodules consistent with recurrent metastatic disease.  Patient has since been placed on Altima, Keytruda and carboplatin that was started on 03/21/2020.  Patient currently follows at [facility] health oncology.  Patient's last dose of this regimen of chemotherapy was on 3/25.  Patient explains that since his last dose chemotherapy he feels that he has tolerated rather well.  He denies significant shortness of breath, chest pain fatigue or lightheadedness.  Patient denies any blood in the stool or melena.  Patient denies any heavy bruising or bleeding elsewhere.  Patient underwent routine blood work today at his skilled nursing facility which revealed the patient was pancytopenic.  Facility reports hemoglobin of 6.5, platelet count 24 and white blood cell count of 1.9.  This is down from values on 3/25 which included white blood cell count of 5.7, hemoglobin of 9.0 and platelet  count of 236.  Patient was therefore sent to [facility] emergency department for evaluation.  Upon evaluation in the emergency department, arrangements have been made for the patient to receive 2 units of packed red blood cell transfusion.  Case was discussed between the emergency room provider and Dr. Alen Blew with oncology.  Oncology recommendation was that patient can remain at [facility] for inpatient care instead of being transferred to [facility] health.  They recommended initiation of the blood transfusion, supportive care, hospitalist service to admit and formal consultation of oncology tomorrow morning if needed.  The hospitalist group was then called to assess the patient for admission to the hospital.  Review of Systems:   Review of Systems  Musculoskeletal: Positive for back pain.  All other systems reviewed and are negative.   Past Medical History:  Diagnosis Date  . Anxiety   . COPD (chronic obstructive pulmonary disease) ([place])   . Dementia ([place])   . Depression   . Diabetes mellitus without complication ([place])   . Hypertension     History reviewed. No pertinent surgical history.   reports that he has quit smoking. His smoking use included cigarettes. He has a 100.00 pack-year smoking history. He has never used smokeless tobacco. He reports that he does not drink alcohol and does not use drugs.  Allergies  Allergen Reactions  . Fentanyl     Other reaction(s): Other (See Comments) Pt stated it made him feel "weird" Pt stated it made him feel "weird"   . Atorvastatin     Other reaction(s): Myalgias (intolerance), Other (See Comments) Leg pain Leg pain   .  Duloxetine     Other reaction(s): Other (See Comments) "not sure what happened" maybe chest got tight "not sure what happened" maybe chest got tight Unspecified reaction     Family History  Problem Relation Age of Onset  . Heart disease Neg Hx      Prior to Admission medications    Medication Sig Start Date End Date Taking? Authorizing Provider  acetaminophen (TYLENOL) 325 MG tablet Take 650 mg by mouth every 6 (six) hours as needed for moderate pain.   Yes [provider]  acetaminophen (TYLENOL) 500 MG tablet Take 1,000 mg by mouth in the morning and at bedtime.   Yes [provider]  ADVAIR DISKUS 250-50 MCG/DOSE AEPB Inhale 1 puff into the lungs 2 (two) times daily. 03/19/20  Yes [provider]  albuterol (PROVENTIL HFA;VENTOLIN HFA) 108 (90 Base) MCG/ACT inhaler Inhale 2 puffs into the lungs daily in the afternoon.   Yes [provider]  ALPRAZolam Duanne Moron) 1 MG tablet Take 1 mg by mouth at bedtime.   Yes [provider]  antiseptic oral rinse (BIOTENE) LIQD 10 mLs by Mouth Rinse route in the morning and at bedtime.   Yes [provider]  atorvastatin (LIPITOR) 40 MG tablet Take 40 mg by mouth at bedtime.   Yes [provider]  B Complex-C (B-COMPLEX WITH VITAMIN C) tablet Take 1 tablet by mouth daily.   Yes [provider]  bisacodyl (DULCOLAX) 5 MG EC tablet Take 10 mg by mouth daily as needed for moderate constipation.   Yes [provider]  busPIRone (BUSPAR) 15 MG tablet Take 15 mg by mouth 3 (three) times daily. 05/01/20  Yes [provider]  folic acid (FOLVITE) 1 MG tablet Take 1 mg by mouth daily. 04/21/20  Yes [provider]  insulin aspart (NOVOLOG) 100 UNIT/ML injection Inject 0-14 Units into the skin as directed. If BS is 70-200=0 units 201-250=2 units 251-300=4 units 301-350=6 units 351-400=8 units 401-450=10 units 451-500=12 units BS>500 or "HIGH"=14 units; Re-check BS in 2 Hours. If BS>400 or <80, Call [place] Triage   Yes [provider]  ipratropium-albuterol (DUONEB) 0.5-2.5 (3) MG/3ML SOLN Inhale 3 mLs into the lungs every 6 (six) hours as needed (sob/wheezing). 04/17/18  Yes [provider]  LEVEMIR 100 UNIT/ML injection Inject 12 Units  into the skin at bedtime. 03/19/20  Yes [provider]  Lidocaine 4 % PTCH Apply 1 patch topically in the morning and at bedtime.   Yes [provider]  lisinopril (ZESTRIL) 5 MG tablet Take 5 mg by mouth daily. 05/16/20  Yes [provider]  melatonin 3 MG TABS tablet Take 3 mg by mouth at bedtime.   Yes [provider]  metFORMIN (GLUCOPHAGE) 1000 MG tablet Take 1,000 mg by mouth 2 (two) times daily. 03/28/16  Yes [provider]  metoprolol succinate (TOPROL-XL) 50 MG 24 hr tablet Take 50 mg by mouth daily. 05/01/20  Yes [provider]  OLANZapine (ZYPREXA) 5 MG tablet Take 5 mg by mouth at bedtime. 05/01/20  Yes [provider]  ondansetron (ZOFRAN-ODT) 4 MG disintegrating tablet Take 4 mg by mouth every 8 (eight) hours as needed for nausea or vomiting. 04/07/20  Yes [provider]  pregabalin (LYRICA) 25 MG capsule Take 25 mg by mouth daily.   Yes [provider]  pregabalin (LYRICA) 50 MG capsule Take 50 mg by mouth at bedtime. 05/06/20  Yes [provider]  sertraline (ZOLOFT) 50 MG tablet  Take 150 mg by mouth daily.   Yes [provider]  tiotropium (SPIRIVA) 18 MCG inhalation capsule Place 18 mcg into inhaler and inhale daily.   Yes [provider]  traZODone (DESYREL) 100 MG tablet Take 200 mg by mouth at bedtime. 05/08/20  Yes [provider]  Vitamin D, Ergocalciferol, (DRISDOL) 1.25 MG (50000 UNIT) CAPS capsule Take 50,000 Units by mouth every 30 (thirty) days.   Yes [provider]    Physical Exam: Vitals:   05/21/20 1830 05/21/20 1900 05/21/20 1930 05/21/20 1945  BP: (!) 105/48 130/88 95/67 110/68  Pulse: 76 78 76 79  Resp: 15 18 (!) 23 14  Temp:      TempSrc:      SpO2: 100% 100% 99% 100%  Weight:      Height:        Constitutional: Awake alert and oriented x3, no associated distress.   Skin: Substantial skin pallor noted.  No rashes, no lesions, good  skin turgor noted. Eyes: Pupils are equally reactive to light.  No evidence of scleral icterus or significant conjunctival pallor noted. ENMT: Moist mucous membranes noted.  Posterior pharynx clear of any exudate or lesions.   Neck: normal, supple, no masses, no thyromegaly.  No evidence of jugular venous distension.   Respiratory: Scattered rhonchi bilaterally, no evidence of wheezing or rales.  Normal respiratory effort. No accessory muscle use.  Cardiovascular: Regular rate and rhythm, no murmurs / rubs / gallops. No extremity edema. 2+ pedal pulses. No carotid bruits.  Chest:   Nontender without crepitus or deformity.   Back:   Nontender without crepitus or deformity. Abdomen: Abdomen is soft and nontender.  No evidence of intra-abdominal masses.  Positive bowel sounds noted in all quadrants.   Musculoskeletal: No joint deformity upper and lower extremities. Good ROM, no contractures. Normal muscle tone.  Neurologic: CN 2-12 grossly intact. Sensation intact.  Patient moving all 4 extremities spontaneously.  Patient is following all commands.  Patient is responsive to verbal stimuli.   Psychiatric: Patient exhibits normal mood with appropriate affect.  Patient seems to possess insight as to their current situation.     Labs on Admission: I have personally reviewed following labs and imaging studies -   CBC: Recent Labs  Lab 05/21/20 1719  WBC 2.1*  NEUTROABS 0.7*  HGB 6.5*  HCT 19.7*  MCV 90.8  PLT 21*   Basic Metabolic Panel: Recent Labs  Lab 05/21/20 1719  NA 137  K 4.5  CL 109  CO2 22  GLUCOSE 210*  BUN 17  CREATININE 0.84  CALCIUM 8.8*  MG 1.4*  PHOS 2.5   GFR: Estimated Creatinine Clearance: 86 mL/min (by C-G formula based on SCr of 0.84 mg/dL). Liver Function Tests: Recent Labs  Lab 05/21/20 1719  AST 28  ALT 31  ALKPHOS 59  BILITOT 0.7  PROT 6.6  ALBUMIN 3.6   No results for input(s): LIPASE, AMYLASE in the last 168 hours. No results for input(s):  AMMONIA in the last 168 hours. Coagulation Profile: No results for input(s): INR, PROTIME in the last 168 hours. Cardiac Enzymes: No results for input(s): CKTOTAL, CKMB, CKMBINDEX, TROPONINI in the last 168 hours. BNP (last 3 results) No results for input(s): PROBNP in the last 8760 hours. HbA1C: No results for input(s): HGBA1C in the last 72 hours. CBG: No results for input(s): GLUCAP in the last 168 hours. Lipid Profile: No results for input(s): CHOL, HDL, LDLCALC, TRIG, CHOLHDL, LDLDIRECT in the last 72  hours. Thyroid Function Tests: No results for input(s): TSH, T4TOTAL, FREET4, T3FREE, THYROIDAB in the last 72 hours. Anemia Panel: Recent Labs    05/21/20 1708  RETICCTPCT 0.4   Urine analysis:    Component Value Date/Time   COLORURINE STRAW (A) 10/27/2016 1501   APPEARANCEUR CLEAR 10/27/2016 1501   LABSPEC 1.011 10/27/2016 1501   PHURINE 7.0 10/27/2016 1501   GLUCOSEU >=500 (A) 10/27/2016 1501   HGBUR NEGATIVE 10/27/2016 1501   BILIRUBINUR NEGATIVE 10/27/2016 1501   KETONESUR NEGATIVE 10/27/2016 1501   PROTEINUR NEGATIVE 10/27/2016 1501   NITRITE NEGATIVE 10/27/2016 1501   LEUKOCYTESUR NEGATIVE 10/27/2016 1501    Radiological Exams on Admission - Personally Reviewed: No results found.  Telemetry: Personally reviewed.  Rhythm is normal sinus rhythm heart rate of 75 bpm.  Assessment/Plan Principal Problem:   Pancytopenia due to antineoplastic chemotherapy [facility])   Patient exhibiting substantial pancytopenia with a dramatic drop in all cell lines compared to blood work performed on 3/25 as mentioned in the HPI.  Patient currently receiving current chemotherapy regimen including Keytruda, carboplatinum and Altima since 03/21/2020 with [facility] health oncology.  Case discussed between the emergency department provider and Dr. Alen Blew with oncology here.  Dr. Nicki Reaper has mentioned that is appropriate to admit here for supportive care and blood transfusion.  2  units packed red blood cell transfusion ordered.  Will obtain posttransfusion CBC.  We will continue to monitor for any evidence of bleeding and monitor for hemodynamic stability.  If cell lines are showing signs of recovery patient will eventually be discharged for close follow-up with his oncologist.  Regimen of chemotherapy going forward will likely need to be adjusted due to this development.  Oncology has recommended that we formally consult them tomorrow if there are any complications.  Active Problems:   Adenocarcinoma of lung ([place])   Initially diagnosed in 2016 and managed with right upper lobe lobectomy  Unfortunately the past several months patient is developed bilateral pulmonary nodules consistent with recurrent metastatic disease  Patient currently receiving current chemotherapy regimen including Keytruda, carboplatinum and Altima since 03/21/2020 with [facility] health oncology.  Close outpatient follow-up with patient's oncologist for discussion about adjustments to chemotherapy regimen at time of discharge.    Dementia without behavioral disturbance ([place])   While patient has a documented known history of dementia patient is awake alert and oriented x3 and exhibits the capacity to make his own medical decisions at this time.  Patient declines contacting any family concerning his condition at this time  Minimizing uncomfortable stimuli to minimize bouts of delirium throughout the hospitalization    COPD (chronic obstructive pulmonary disease) ([place])   No evidence of COPD exacerbation  Continuing home regimen of maintenance inhalers  As needed bronchodilator therapy for shortness of breath and wheezing    Essential hypertension   Continue home regimen of antihypertensive therapy    Type 2 diabetes mellitus with hyperglycemia, with long-term current use of insulin (HCC)   Resuming home regimen of Lantus nightly  Accu-Cheks before every meal and  nightly with sliding scale insulin  Hemoglobin A1c pending  Holding home regimen of Metformin    Mixed diabetic hyperlipidemia associated with type 2 diabetes mellitus ([place])   Continue home regimen of statin therapy    Hypomagnesemia   Replacing with intravenous magnesium sulfate  Monitoring magnesium levels with serial chemistries    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   Code Status:  Full code Family Communication: deferred   Status is: Observation  The patient remains OBS appropriate and will d/c before 2 midnights.  Dispo: The patient is from: SNF              Anticipated d/c is to: SNF              Patient currently is not medically stable to d/c.   Difficult to place patient No        Vernelle Emerald MD [facility] Pager [phone]  If 7PM-7AM, please contact night-coverage [email or web address] Use universal [place] password for that web site. If you do not have the password, please call the hospital operator.  05/21/2020, 9:01 PM

## 2020-05-21 NOTE — ED Notes (Signed)
Condom cath placed on pt 

## 2020-05-21 NOTE — ED Triage Notes (Signed)
Pt presents from nursing facility for low hgb, low platelets, and low WBC. Per EMS pt is undergoing chemo for lung cancer and had blood work drawn today. Pt c/o generalized weakness.

## 2020-05-21 NOTE — ED Notes (Signed)
Accessed pt port w/blood return.

## 2020-05-21 NOTE — ED Notes (Addendum)
ED TO INPATIENT HANDOFF REPORT  Name/Age/Gender Andrew [place] 75 y.o. male  Code Status    Code Status Orders  (From admission, onward)         Start     Ordered   05/21/20 2101  Full code  Continuous        05/21/20 2100        Code Status History    Date Active Date Inactive Code Status Order ID Comments User Context   10/27/2016 1908 10/28/2016 1649 Full Code [id number]  Annita Brod, MD Inpatient   Advance Care Planning Activity      Home/SNF/Other SNF  Chief Complaint Pancytopenia due to antineoplastic chemotherapy [facility]) [D61.810, T45.1X5A]  Level of Care/Admitting Diagnosis ED Disposition    ED Disposition Condition [place]: [place] [[id number]]  Level of Care: Telemetry [5]  Admit to tele based on following criteria: Monitor for Ischemic changes  Covid Evaluation: Asymptomatic Screening Protocol (No Symptoms)  Diagnosis: Pancytopenia due to antineoplastic chemotherapy [facility]) [[id number]]  Admitting Physician: Vernelle Emerald [[id number]]  Attending Physician: Vernelle Emerald [[id number]]       Medical History Past Medical History:  Diagnosis Date  . Anxiety   . COPD (chronic obstructive pulmonary disease) ([place])   . Dementia ([place])   . Depression   . Diabetes mellitus without complication ([place])   . Hypertension     Allergies Allergies  Allergen Reactions  . Fentanyl     Other reaction(s): Other (See Comments) Pt stated it made him feel "weird" Pt stated it made him feel "weird"   . Atorvastatin     Other reaction(s): Myalgias (intolerance), Other (See Comments) Leg pain Leg pain   . Duloxetine     Other reaction(s): Other (See Comments) "not sure what happened" maybe chest got tight "not sure what happened" maybe chest got tight Unspecified reaction     IV Location/Drains/Wounds Patient Lines/Drains/Airways Status    Active Line/Drains/Airways    Name Placement date Placement time Site  Days   Peripheral IV 05/21/20 Anterior;Distal;Right;Upper Antecubital 05/21/20  1725  Antecubital  less than 1   External Urinary Catheter 05/21/20  1901  --  less than 1          Labs/Imaging Results for orders placed or performed during the hospital encounter of 05/21/20 (from the past 48 hour(s))  Reticulocytes     Status: Abnormal   Collection Time: 05/21/20  5:08 PM  Result Value Ref Range   Retic Ct Pct 0.4 0.4 - 3.1 %    Comment: REPEATED TO VERIFY   RBC. 2.19 (L) 4.22 - 5.81 MIL/uL   Retic Count, Absolute 8.6 (L) 19.0 - 186.0 K/uL   Immature Retic Fract 11.4 2.3 - 15.9 %    Comment: Performed at [facility], [place] [address], [place], [place] [address]  Comprehensive metabolic panel     Status: Abnormal   Collection Time: 05/21/20  5:19 PM  Result Value Ref Range   Sodium 137 135 - 145 mmol/L   Potassium 4.5 3.5 - 5.1 mmol/L   Chloride 109 98 - 111 mmol/L   CO2 22 22 - 32 mmol/L   Glucose, Bld 210 (H) 70 - 99 mg/dL    Comment: Glucose reference range applies only to samples taken after fasting for at least 8 hours.   BUN 17 8 - 23 mg/dL   Creatinine, Ser 0.84 0.61 - 1.24 mg/dL   Calcium 8.8 (L) 8.9 -  10.3 mg/dL   Total Protein 6.6 6.5 - 8.1 g/dL   Albumin 3.6 3.5 - 5.0 g/dL   AST 28 15 - 41 U/L   ALT 31 0 - 44 U/L   Alkaline Phosphatase 59 38 - 126 U/L   Total Bilirubin 0.7 0.3 - 1.2 mg/dL   GFR, Estimated >60 >60 mL/min    Comment: (NOTE) Calculated using the CKD-EPI Creatinine Equation (2021)    Anion gap 6 5 - 15    Comment: Performed at [facility], [place] [address] [place], [place] [address]  CBC with Differential     Status: Abnormal   Collection Time: 05/21/20  5:19 PM  Result Value Ref Range   WBC 2.1 (L) 4.0 - 10.5 K/uL   RBC 2.17 (L) 4.22 - 5.81 MIL/uL   Hemoglobin 6.5 (LL) 13.0 - 17.0 g/dL    Comment: REPEATED TO VERIFY CRITICAL RESULT CALLED TO, READ BACK BY AND VERIFIED WITH: A.WOODY, RN AT 1828 ON 04.06.22  BY N.THOMPSON    HCT 19.7 (L) 39.0 - 52.0 %   MCV 90.8 80.0 - 100.0 fL   MCH 30.0 26.0 - 34.0 pg   MCHC 33.0 30.0 - 36.0 g/dL   RDW 15.0 11.5 - 15.5 %   Platelets 21 (LL) 150 - 400 K/uL    Comment: SPECIMEN CHECKED FOR CLOTS Immature Platelet Fraction may be clinically indicated, consider ordering this additional test [id number] REPEATED TO VERIFY PLATELET COUNT CONFIRMED BY SMEAR CRITICAL RESULT CALLED TO, READ BACK BY AND VERIFIED WITH: A.WOODY, RN AT 1828 ON 04.06.22 BY N.THOMPSON    nRBC 0.0 0.0 - 0.2 %   Neutrophils Relative % 30 %   Neutro Abs 0.7 (L) 1.7 - 7.7 K/uL   Lymphocytes Relative 57 %   Lymphs Abs 1.2 0.7 - 4.0 K/uL   Monocytes Relative 8 %   Monocytes Absolute 0.2 0.1 - 1.0 K/uL   Eosinophils Relative 4 %   Eosinophils Absolute 0.1 0.0 - 0.5 K/uL   Basophils Relative 0 %   Basophils Absolute 0.0 0.0 - 0.1 K/uL   Immature Granulocytes 1 %   Abs Immature Granulocytes 0.01 0.00 - 0.07 K/uL   Tear Drop Cells PRESENT    Ovalocytes PRESENT     Comment: Performed at [facility], [place] [address], [place], [place] [address]  Magnesium     Status: Abnormal   Collection Time: 05/21/20  5:19 PM  Result Value Ref Range   Magnesium 1.4 (L) 1.7 - 2.4 mg/dL    Comment: Performed at [facility], [place] [address] [place], [place] [address]  Phosphorus     Status: None   Collection Time: 05/21/20  5:19 PM  Result Value Ref Range   Phosphorus 2.5 2.5 - 4.6 mg/dL    Comment: Performed at [facility], [place] [address], [place], [place] [address]  ABO/Rh     Status: None   Collection Time: 05/21/20  5:19 PM  Result Value Ref Range   ABO/RH(D)      A NEG Performed at [place] [address] [place], [place] [address]   Type and screen     Status: None   Collection Time: 05/21/20  5:25 PM  Result Value Ref Range   ABO/RH(D) A NEG    Antibody Screen NEG    Sample Expiration       05/24/2020,2359 Performed at [facility], [place] [address], [place], [place] [address]  DG Chest 1 View  Result Date: 05/21/2020 CLINICAL DATA:  Shortness of breath. Low white cell count. History of lung cancer. EXAM: CHEST  1 VIEW COMPARISON:  04/11/2020 FINDINGS: Power port type central venous catheter with tip over the cavoatrial junction region. Heart size and pulmonary vascularity are normal. Shallow inspiration. Nodular and interstitial changes seen in the lungs on prior study are less prominent than previously. No developing consolidation. No pleural effusions. No pneumothorax. IMPRESSION: Shallow inspiration. No evidence of active pulmonary disease. Nodular infiltration seen previously has improved. Electronically Signed   By: Lucienne Capers M.D.   On: 05/21/2020 21:00    Pending Labs Unresulted Labs (From admission, onward)          Start     Ordered   05/22/20 0500  Comprehensive metabolic panel  Tomorrow morning,   R        05/21/20 2100   05/22/20 0500  Magnesium  Tomorrow morning,   R        05/21/20 2100   05/22/20 0500  CBC WITH DIFFERENTIAL  Tomorrow morning,   R        05/21/20 2100   05/22/20 0500  APTT  Tomorrow morning,   R        05/21/20 2100   05/22/20 0500  Protime-INR  Tomorrow morning,   R        05/21/20 2100   05/21/20 2026  Hemoglobin A1c  Add-on,   AD        05/21/20 2025   05/21/20 2025  Resp Panel by RT-PCR (Flu A&B, Covid) Nasopharyngeal Swab  (Tier 2 - Symptomatic/asymptomatic with Precautions )  Once,   STAT       Question Answer Comment  Is this test for diagnosis or screening Screening   Symptomatic for COVID-19 as defined by CDC No   Hospitalized for COVID-19 No   Admitted to ICU for COVID-19 No   Previously tested for COVID-19 No   Resident in a congregate (group) care setting No   Employed in healthcare setting No   Has patient completed COVID vaccination(s) (2 doses of Pfizer/Moderna 1 dose of [employer]) Unknown       05/21/20 2024   05/21/20 2025  Urinalysis, Complete w Microscopic  Once,   STAT        05/21/20 2024   05/21/20 2020  Prepare RBC (crossmatch)  (Adult Blood Administration - PRBC)  Once,   R       Question Answer Comment  # of Units 2 units   Transfusion Indications Symptomatic Anemia   Number of Units to Keep Ahead NO units ahead   If emergent release call blood bank [facility] [phone]      05/21/20 2020          Vitals/Pain Today's Vitals   05/21/20 1830 05/21/20 1900 05/21/20 1930 05/21/20 1945  BP: (!) 105/48 130/88 95/67 110/68  Pulse: 76 78 76 79  Resp: 15 18 (!) 23 14  Temp:      TempSrc:      SpO2: 100% 100% 99% 100%  Weight:      Height:      PainSc:        Isolation Precautions Airborne and Contact precautions  Medications Medications  0.9 %  sodium chloride infusion (has no administration in time range)  magnesium sulfate IVPB 4 g 100 mL (has no administration in time range)  atorvastatin (LIPITOR) tablet 40 mg (has no administration in time  range)  lisinopril (ZESTRIL) tablet 5 mg (has no administration in time range)  metoprolol succinate (TOPROL-XL) 24 hr tablet 50 mg (has no administration in time range)  ALPRAZolam (XANAX) tablet 1 mg (has no administration in time range)  busPIRone (BUSPAR) tablet 15 mg (has no administration in time range)  OLANZapine (ZYPREXA) tablet 5 mg (has no administration in time range)  sertraline (ZOLOFT) tablet 150 mg (has no administration in time range)  traZODone (DESYREL) tablet 200 mg (has no administration in time range)  insulin detemir (LEVEMIR) injection 12 Units (has no administration in time range)  folic acid (FOLVITE) tablet 1 mg (has no administration in time range)  melatonin tablet 3 mg (has no administration in time range)  pregabalin (LYRICA) capsule 25 mg (has no administration in time range)  pregabalin (LYRICA) capsule 50 mg (has no administration in time range)  fluticasone  furoate-vilanterol (BREO ELLIPTA) 200-25 MCG/INH 1 puff (has no administration in time range)  albuterol (VENTOLIN HFA) 108 (90 Base) MCG/ACT inhaler 2 puff (has no administration in time range)  ipratropium-albuterol (DUONEB) 0.5-2.5 (3) MG/3ML nebulizer solution 3 mL (has no administration in time range)  insulin aspart (novoLOG) injection 0-15 Units (has no administration in time range)  acetaminophen (TYLENOL) tablet 650 mg (has no administration in time range)    Or  acetaminophen (TYLENOL) suppository 650 mg (has no administration in time range)  polyethylene glycol (MIRALAX / GLYCOLAX) packet 17 g (has no administration in time range)  ondansetron (ZOFRAN) tablet 4 mg (has no administration in time range)    Or  ondansetron (ZOFRAN) injection 4 mg (has no administration in time range)  umeclidinium bromide (INCRUSE ELLIPTA) 62.5 MCG/INH 1 puff (has no administration in time range)  sodium chloride 0.9 % bolus 1,000 mL (0 mLs Intravenous Stopped 05/21/20 1927)    Mobility walks

## 2020-05-22 ENCOUNTER — Encounter (HOSPITAL_COMMUNITY): Payer: Self-pay | Admitting: Internal Medicine

## 2020-05-22 DIAGNOSIS — Z888 Allergy status to other drugs, medicaments and biological substances status: Secondary | ICD-10-CM | POA: Diagnosis not present

## 2020-05-22 DIAGNOSIS — E1165 Type 2 diabetes mellitus with hyperglycemia: Secondary | ICD-10-CM | POA: Diagnosis present

## 2020-05-22 DIAGNOSIS — F419 Anxiety disorder, unspecified: Secondary | ICD-10-CM | POA: Diagnosis present

## 2020-05-22 DIAGNOSIS — J189 Pneumonia, unspecified organism: Secondary | ICD-10-CM | POA: Diagnosis not present

## 2020-05-22 DIAGNOSIS — E1169 Type 2 diabetes mellitus with other specified complication: Secondary | ICD-10-CM | POA: Diagnosis present

## 2020-05-22 DIAGNOSIS — Z20822 Contact with and (suspected) exposure to covid-19: Secondary | ICD-10-CM | POA: Diagnosis present

## 2020-05-22 DIAGNOSIS — Z902 Acquired absence of lung [part of]: Secondary | ICD-10-CM | POA: Diagnosis not present

## 2020-05-22 DIAGNOSIS — C7800 Secondary malignant neoplasm of unspecified lung: Secondary | ICD-10-CM | POA: Diagnosis present

## 2020-05-22 DIAGNOSIS — T451X5A Adverse effect of antineoplastic and immunosuppressive drugs, initial encounter: Secondary | ICD-10-CM | POA: Diagnosis present

## 2020-05-22 DIAGNOSIS — Z87891 Personal history of nicotine dependence: Secondary | ICD-10-CM | POA: Diagnosis not present

## 2020-05-22 DIAGNOSIS — E785 Hyperlipidemia, unspecified: Secondary | ICD-10-CM | POA: Diagnosis present

## 2020-05-22 DIAGNOSIS — F32A Depression, unspecified: Secondary | ICD-10-CM | POA: Diagnosis present

## 2020-05-22 DIAGNOSIS — J449 Chronic obstructive pulmonary disease, unspecified: Secondary | ICD-10-CM | POA: Diagnosis present

## 2020-05-22 DIAGNOSIS — Z85118 Personal history of other malignant neoplasm of bronchus and lung: Secondary | ICD-10-CM | POA: Diagnosis not present

## 2020-05-22 DIAGNOSIS — E876 Hypokalemia: Secondary | ICD-10-CM | POA: Diagnosis present

## 2020-05-22 DIAGNOSIS — Z7951 Long term (current) use of inhaled steroids: Secondary | ICD-10-CM | POA: Diagnosis not present

## 2020-05-22 DIAGNOSIS — F0390 Unspecified dementia without behavioral disturbance: Secondary | ICD-10-CM | POA: Diagnosis present

## 2020-05-22 DIAGNOSIS — Z79899 Other long term (current) drug therapy: Secondary | ICD-10-CM | POA: Diagnosis not present

## 2020-05-22 DIAGNOSIS — D61810 Antineoplastic chemotherapy induced pancytopenia: Principal | ICD-10-CM

## 2020-05-22 DIAGNOSIS — Z794 Long term (current) use of insulin: Secondary | ICD-10-CM | POA: Diagnosis not present

## 2020-05-22 DIAGNOSIS — K7460 Unspecified cirrhosis of liver: Secondary | ICD-10-CM | POA: Diagnosis present

## 2020-05-22 DIAGNOSIS — B1920 Unspecified viral hepatitis C without hepatic coma: Secondary | ICD-10-CM | POA: Diagnosis present

## 2020-05-22 DIAGNOSIS — D701 Agranulocytosis secondary to cancer chemotherapy: Secondary | ICD-10-CM | POA: Diagnosis present

## 2020-05-22 DIAGNOSIS — I10 Essential (primary) hypertension: Secondary | ICD-10-CM | POA: Diagnosis present

## 2020-05-22 LAB — CBC WITH DIFFERENTIAL/PLATELET
Abs Immature Granulocytes: 0 10*3/uL (ref 0.00–0.07)
Basophils Absolute: 0 10*3/uL (ref 0.0–0.1)
Basophils Relative: 0 %
Eosinophils Absolute: 0.1 10*3/uL (ref 0.0–0.5)
Eosinophils Relative: 5 %
HCT: 25.5 % — ABNORMAL LOW (ref 39.0–52.0)
Hemoglobin: 8.4 g/dL — ABNORMAL LOW (ref 13.0–17.0)
Immature Granulocytes: 0 %
Lymphocytes Relative: 57 %
Lymphs Abs: 0.9 10*3/uL (ref 0.7–4.0)
MCH: 28.1 pg (ref 26.0–34.0)
MCHC: 32.9 g/dL (ref 30.0–36.0)
MCV: 85.3 fL (ref 80.0–100.0)
Monocytes Absolute: 0.2 10*3/uL (ref 0.1–1.0)
Monocytes Relative: 10 %
Neutro Abs: 0.4 10*3/uL — CL (ref 1.7–7.7)
Neutrophils Relative %: 28 %
Platelets: 13 10*3/uL — CL (ref 150–400)
RBC: 2.99 MIL/uL — ABNORMAL LOW (ref 4.22–5.81)
RDW: 15.8 % — ABNORMAL HIGH (ref 11.5–15.5)
WBC: 1.5 10*3/uL — ABNORMAL LOW (ref 4.0–10.5)
nRBC: 0 % (ref 0.0–0.2)

## 2020-05-22 LAB — COMPREHENSIVE METABOLIC PANEL
ALT: 28 U/L (ref 0–44)
AST: 26 U/L (ref 15–41)
Albumin: 3.4 g/dL — ABNORMAL LOW (ref 3.5–5.0)
Alkaline Phosphatase: 55 U/L (ref 38–126)
Anion gap: 6 (ref 5–15)
BUN: 12 mg/dL (ref 8–23)
CO2: 22 mmol/L (ref 22–32)
Calcium: 8.4 mg/dL — ABNORMAL LOW (ref 8.9–10.3)
Chloride: 112 mmol/L — ABNORMAL HIGH (ref 98–111)
Creatinine, Ser: 0.64 mg/dL (ref 0.61–1.24)
GFR, Estimated: >60 mL/min (ref >60)
Glucose, Bld: 136 mg/dL — ABNORMAL HIGH (ref 70–99)
Potassium: 3.7 mmol/L (ref 3.5–5.1)
Sodium: 140 mmol/L (ref 135–145)
Total Bilirubin: 1.3 mg/dL — ABNORMAL HIGH (ref 0.3–1.2)
Total Protein: 6.1 g/dL — ABNORMAL LOW (ref 6.5–8.1)

## 2020-05-22 LAB — HEMOGLOBIN A1C
Hgb A1c MFr Bld: 8.1 % — ABNORMAL HIGH (ref 4.8–5.6)
Mean Plasma Glucose: 185.77 mg/dL

## 2020-05-22 LAB — GLUCOSE, CAPILLARY
Glucose-Capillary: 138 mg/dL — ABNORMAL HIGH (ref 70–99)
Glucose-Capillary: 146 mg/dL — ABNORMAL HIGH (ref 70–99)
Glucose-Capillary: 150 mg/dL — ABNORMAL HIGH (ref 70–99)
Glucose-Capillary: 181 mg/dL — ABNORMAL HIGH (ref 70–99)

## 2020-05-22 LAB — PROTIME-INR
INR: 1.1 (ref 0.8–1.2)
Prothrombin Time: 13.9 seconds (ref 11.4–15.2)

## 2020-05-22 LAB — MAGNESIUM: Magnesium: 1.9 mg/dL (ref 1.7–2.4)

## 2020-05-22 LAB — APTT: aPTT: 32 seconds (ref 24–36)

## 2020-05-22 LAB — MRSA PCR SCREENING: MRSA by PCR: NEGATIVE

## 2020-05-22 MED ORDER — CYANOCOBALAMIN 1000 MCG/ML IJ SOLN
1000.0000 ug | Freq: Every day | INTRAMUSCULAR | Status: AC
  Administered 2020-05-22 – 2020-05-24 (×3): 1000 ug via SUBCUTANEOUS
  Filled 2020-05-22 (×3): qty 1

## 2020-05-22 MED ORDER — TBO-FILGRASTIM 480 MCG/0.8ML ~~LOC~~ SOSY
480.0000 ug | PREFILLED_SYRINGE | Freq: Every day | SUBCUTANEOUS | Status: DC
Start: 2020-05-22 — End: 2020-05-26
  Administered 2020-05-22 – 2020-05-25 (×4): 480 ug via SUBCUTANEOUS
  Filled 2020-05-22 (×4): qty 0.8

## 2020-05-22 MED ORDER — CHLORHEXIDINE GLUCONATE CLOTH 2 % EX PADS
6.0000 | MEDICATED_PAD | Freq: Every day | CUTANEOUS | Status: DC
  Administered 2020-05-22 – 2020-05-25 (×4): 6 via TOPICAL

## 2020-05-22 MED ORDER — B COMPLEX-C PO TABS
1.0000 | ORAL_TABLET | Freq: Every day | ORAL | Status: DC
  Administered 2020-05-22 – 2020-05-26 (×5): 1 via ORAL
  Filled 2020-05-22 (×5): qty 1

## 2020-05-22 NOTE — Consult Note (Addendum)
[place]  [phone] [phone]) [phone]   MEDICAL ONCOLOGY - INITIAL CONSULTATION  Referral MD: Dr. Terrilee Croak  Reason for Referral: Pancytopenia due to recent chemotherapy  HPI: Mr. Adella Hare is a 75 year old male with a past medical history significant for dementia, hyperlipidemia, hypertension, hepatitis C (status post treatment), cirrhosis, COPD, and adenocarcinoma of the right lung.  The patient is currently receiving chemotherapy at the [facility].  The patient received carboplatin, Alimta, and Keytruda last on 05/09/2020.  It appears that this was dose reduced by 25% at his last visit.  The patient is currently residing in a skilled nursing facility.  He had lab work performed there which showed pancytopenia.  Due to his abnormal lab work, he was sent to the emergency room for further evaluation.  Labs from the facility showed hemoglobin 6.5, platelet count of 24,000, and WBC of 1.9.  He has received 2 units PRBC so far this admission.  I met with the patient in his hospital room today.  He tells me that he feels "achy" but offers no other specific complaints today.  He does not seem to recall receiving chemotherapy or know the name of his oncologist.  He cannot provide any details to me about whether he received G-CSF with his chemotherapy.  I confirmed with nursing that he is not bleeding.  He is not having any fevers or chills.  Medical oncology was asked see the patient for recommendations regarding his pancytopenia due to recent chemotherapy.   Past Medical History:  Diagnosis Date  . Anxiety   . COPD (chronic obstructive pulmonary disease) ([place])   . Dementia ([place])   . Depression   . Diabetes mellitus without complication ([place])   . Hypertension   :  History reviewed. No pertinent surgical history.:  Current Facility-Administered Medications  Medication Dose Route Frequency Provider Last Rate Last Admin  . 0.9 %  sodium  chloride infusion  10 mL/hr Intravenous Once Vernelle Emerald, MD      . acetaminophen (TYLENOL) tablet 650 mg  650 mg Oral Q6H PRN Vernelle Emerald, MD   650 mg at 05/21/20 2229  . albuterol (VENTOLIN HFA) 108 (90 Base) MCG/ACT inhaler 2 puff  2 puff Inhalation Q4H PRN Shalhoub, Sherryll Burger, MD      . ALPRAZolam Duanne Moron) tablet 1 mg  1 mg Oral QHS Shalhoub, Sherryll Burger, MD   1 mg at 05/21/20 2229  . atorvastatin (LIPITOR) tablet 40 mg  40 mg Oral QHS Shalhoub, Sherryll Burger, MD   40 mg at 05/21/20 2229  . busPIRone (BUSPAR) tablet 15 mg  15 mg Oral TID Vernelle Emerald, MD   15 mg at 05/22/20 1025  . Chlorhexidine Gluconate Cloth 2 % PADS 6 each  6 each Topical Daily Shalhoub, Sherryll Burger, MD      . fluticasone furoate-vilanterol (BREO ELLIPTA) 200-25 MCG/INH 1 puff  1 puff Inhalation Daily Shalhoub, Sherryll Burger, MD      . folic acid (FOLVITE) tablet 1 mg  1 mg Oral Daily Shalhoub, Sherryll Burger, MD   1 mg at 05/22/20 1024  . insulin aspart (novoLOG) injection 0-15 Units  0-15 Units Subcutaneous TID AC & HS Shalhoub, Sherryll Burger, MD   2 Units at 05/22/20 [phone]  . insulin detemir (LEVEMIR) injection 12 Units  12 Units Subcutaneous QHS Vernelle Emerald, MD   12 Units at 05/21/20 2230  . ipratropium-albuterol (DUONEB) 0.5-2.5 (3) MG/3ML nebulizer solution 3 mL  3  mL Inhalation Q4H PRN Shalhoub, Sherryll Burger, MD      . lidocaine (LIDODERM) 5 % 1 patch  1 patch Transdermal Q24H Shalhoub, Sherryll Burger, MD   1 patch at 05/21/20 2230  . lisinopril (ZESTRIL) tablet 5 mg  5 mg Oral Daily Shalhoub, Sherryll Burger, MD   5 mg at 05/22/20 1025  . melatonin tablet 3 mg  3 mg Oral QHS Shalhoub, Sherryll Burger, MD   3 mg at 05/21/20 2229  . metoprolol succinate (TOPROL-XL) 24 hr tablet 50 mg  50 mg Oral Daily Shalhoub, Sherryll Burger, MD   50 mg at 05/22/20 1024  . OLANZapine (ZYPREXA) tablet 5 mg  5 mg Oral QHS Shalhoub, Sherryll Burger, MD   5 mg at 05/21/20 2229  . ondansetron (ZOFRAN) tablet 4 mg  4 mg Oral Q6H PRN Shalhoub, Sherryll Burger, MD       Or  .  ondansetron [facility]) injection 4 mg  4 mg Intravenous Q6H PRN Shalhoub, Sherryll Burger, MD      . polyethylene glycol (MIRALAX / GLYCOLAX) packet 17 g  17 g Oral Daily PRN Shalhoub, Sherryll Burger, MD      . pregabalin (LYRICA) capsule 25 mg  25 mg Oral Daily Shalhoub, Sherryll Burger, MD   25 mg at 05/22/20 1024  . pregabalin (LYRICA) capsule 50 mg  50 mg Oral QHS Shalhoub, Sherryll Burger, MD   50 mg at 05/21/20 2229  . sertraline (ZOLOFT) tablet 150 mg  150 mg Oral Daily Shalhoub, Sherryll Burger, MD   150 mg at 05/22/20 1024  . traZODone (DESYREL) tablet 200 mg  200 mg Oral QHS Shalhoub, Sherryll Burger, MD   200 mg at 05/21/20 2229  . umeclidinium bromide (INCRUSE ELLIPTA) 62.5 MCG/INH 1 puff  1 puff Inhalation Daily Shalhoub, Sherryll Burger, MD         Allergies  Allergen Reactions  . Fentanyl     Other reaction(s): Other (See Comments) Pt stated it made him feel "weird" Pt stated it made him feel "weird"   . Atorvastatin     Other reaction(s): Myalgias (intolerance), Other (See Comments) Leg pain Leg pain   . Duloxetine     Other reaction(s): Other (See Comments) "not sure what happened" maybe chest got tight "not sure what happened" maybe chest got tight Unspecified reaction   :  Family History  Problem Relation Age of Onset  . Heart disease Neg Hx   :  Social History   Socioeconomic History  . Marital status: Widowed    Spouse name: Not on file  . Number of children: Not on file  . Years of education: Not on file  . Highest education level: Not on file  Occupational History  . Not on file  Tobacco Use  . Smoking status: Former Smoker    Packs/day: 2.00    Years: 50.00    Pack years: 100.00    Types: Cigarettes  . Smokeless tobacco: Never Used  Vaping Use  . Vaping Use: Never used  Substance and Sexual Activity  . Alcohol use: No  . Drug use: No  . Sexual activity: Never  Other Topics Concern  . Not on file  Social History Narrative  . Not on file   Social Determinants of Health    Financial Resource Strain: Not on file  Food Insecurity: Not on file  Transportation Needs: Not on file  Physical Activity: Not on file  Stress: Not on file  Social Connections: Not on file  Intimate Partner Violence: Not on file  :  Review of Systems: As noted in HPI, unable to obtain a comprehensive review of systems due to dementia.  Exam: Patient Vitals for the past 24 hrs:  BP Temp Temp src Pulse Resp SpO2 Height Weight  05/22/20 1002 131/75 97.7 F (36.5 C) Oral 74 16 97 % -- --  05/22/20 0550 130/73 97.9 F (36.6 C) Oral 66 18 99 % -- 74.8 kg  05/22/20 0222 105/60 97.6 F (36.4 C) Oral 64 19 98 % -- --  05/22/20 0158 116/65 97.7 F (36.5 C) Oral 71 18 96 % -- --  05/21/20 2325 (!) 117/59 97.7 F (36.5 C) Oral 78 18 95 % -- --  05/21/20 2304 (!) 104/51 97.6 F (36.4 C) Oral 70 18 100 % -- --  05/21/20 2157 116/74 98 F (36.7 C) Oral 73 18 94 % -- --  05/21/20 1945 110/68 -- -- 79 14 100 % -- --  05/21/20 1930 95/67 -- -- 76 (!) 23 99 % -- --  05/21/20 1900 130/88 -- -- 78 18 100 % -- --  05/21/20 1830 (!) 105/48 -- -- 76 15 100 % -- --  05/21/20 1745 102/60 -- -- 85 14 97 % -- --  05/21/20 1714 107/64 -- -- 84 14 99 % -- --  05/21/20 1648 104/61 -- -- 86 19 100 % -- --  05/21/20 1643 104/71 -- -- 85 -- 99 % -- --  05/21/20 1641 104/71 98.1 F (36.7 C) Oral 80 19 98 % -- --  05/21/20 1636 -- -- -- -- 18 -- 6' (1.829 m) 78 kg    General: Well-appearing male, no distress.   Eyes:  no scleral icterus.   ENT:  There were no oropharyngeal lesions.    Respiratory: lungs were clear bilaterally without wheezing or crackles.   Cardiovascular:  Regular rate and rhythm, S1/S2, without murmur, rub or gallop.  There was no pedal edema.   GI:  abdomen was soft, flat, nontender, nondistended, without organomegaly.   Skin exam was without echymosis, petichae.   Neuro: Awake, pleasantly confused.   Lab Results  Component Value Date   WBC 1.5 (L) 05/22/2020   HGB 8.4 (L)  05/22/2020   HCT 25.5 (L) 05/22/2020   PLT 13 (LL) 05/22/2020   GLUCOSE 136 (H) 05/22/2020   ALT 28 05/22/2020   AST 26 05/22/2020   NA 140 05/22/2020   K 3.7 05/22/2020   CL 112 (H) 05/22/2020   CREATININE 0.64 05/22/2020   BUN 12 05/22/2020   CO2 22 05/22/2020    DG Chest 1 View  Result Date: 05/21/2020 CLINICAL DATA:  Shortness of breath. Low white cell count. History of lung cancer. EXAM: CHEST  1 VIEW COMPARISON:  04/11/2020 FINDINGS: Power port type central venous catheter with tip over the cavoatrial junction region. Heart size and pulmonary vascularity are normal. Shallow inspiration. Nodular and interstitial changes seen in the lungs on prior study are less prominent than previously. No developing consolidation. No pleural effusions. No pneumothorax. IMPRESSION: Shallow inspiration. No evidence of active pulmonary disease. Nodular infiltration seen previously has improved. Electronically Signed   By: Lucienne Capers M.D.   On: 05/21/2020 21:00     DG Chest 1 View  Result Date: 05/21/2020 CLINICAL DATA:  Shortness of breath. Low white cell count. History of lung cancer. EXAM: CHEST  1 VIEW COMPARISON:  04/11/2020 FINDINGS: Power port type central venous catheter with tip over the cavoatrial junction region.  Heart size and pulmonary vascularity are normal. Shallow inspiration. Nodular and interstitial changes seen in the lungs on prior study are less prominent than previously. No developing consolidation. No pleural effusions. No pneumothorax. IMPRESSION: Shallow inspiration. No evidence of active pulmonary disease. Nodular infiltration seen previously has improved. Electronically Signed   By: Lucienne Capers M.D.   On: 05/21/2020 21:00   Assessment and Plan:  #1 metastatic lung adenocarcinoma -managed at [facility] #2 chemotherapy related pancytopenia-last chemotherapy carboplatin/Alimta/Keytruda on 3/25.  Unclear if patient received G-CSF. #3 symptomatic anemia hemoglobin  6.5 status post 2 units of PRBCs hemoglobin now 8.4 #4 thrombocytopenia.  Patient is about 13 days post chemotherapy.  Platelets have not nadired yet and are still dropping at 13k #5 severe neutropenia related to chemotherapy his ANC is down to 400 and does not seem to have nadired yet.  Overall his pancytopenia is also worse due to the presence of liver cirrhosis which would be an additional risk factor as well as being on psychotropics which could add to his neutropenia.  Plan -Given severe neutropenia and his other associated medication and conditions that could worsen bone marrow suppression and his age of more than 26 and aggressive treatment -ordered Granix daily until his [place] is more than 1000 -Transfuse PRBC for hemoglobin less than 7.5 or if symptomatic -Transfuse platelets prophylactically if platelets less than 10k or if bleeding. -Would recommend starting the patient on B complex p.o. daily to address any Alimta related bone marrow suppression and also giving vitamin B12 subcu daily x3. -Management of psychotropics including the Zyprexa and pregabalin per hospital medicine. -Please call oncology if any additional questions. -I have attempted to call his primary oncologist.  I had a leave a voicemail. -Recommend outpatient follow-up with his primary oncologist upon discharge.  Thank you for this referral.   Mikey Bussing, DNP, AGPCNP-BC, AOCNP  ADDENDUM  .Patient was Personally and independently interviewed, examined and relevant elements of the history of present illness were reviewed in details and an assessment and plan was created. All elements of the patient's history of present illness , assessment and plan were discussed in details with Mikey Bussing, DNP, AGPCNP-BC, AOCNP. The above documentation reflects our combined findings assessment and plan.  Sullivan Lone MD MS

## 2020-05-22 NOTE — Progress Notes (Signed)
Pt tolerated both units of PRBC well. Transfusion finished at 0548, so labs cannot be drawn until 0748 or later. Continue to monitor. Hortencia Conradi RN

## 2020-05-22 NOTE — Progress Notes (Signed)
Hematology Short Note  Patient with metastatic lung adenocarcinoma followed by Dr. Wendee Beavers at [facility] admitted with pancytopenia related to carboplatin Alimta Keytruda chemotherapy last received on 3/25 [this was likely a second or third cycle of treatment]. No record of receiving G-CSF.  Patient has significant dementia and cannot tell us if he received this.  Primary oncologist not contactable at this time.  Symptomatic anemia with a hemoglobin of 6.5, platelet count of 24k and WBC count of 1.9k ANC of 800  . CBC    Component Value Date/Time   WBC 1.5 (L) 05/22/2020 0822   RBC 2.99 (L) 05/22/2020 0822   HGB 8.4 (L) 05/22/2020 0822   HCT 25.5 (L) 05/22/2020 0822   PLT 13 (LL) 05/22/2020 0822   MCV 85.3 05/22/2020 0822   MCH 28.1 05/22/2020 0822   MCHC 32.9 05/22/2020 0822   RDW 15.8 (H) 05/22/2020 0822   LYMPHSABS 0.9 05/22/2020 0822   MONOABS 0.2 05/22/2020 0822   EOSABS 0.1 05/22/2020 0822   BASOSABS 0.0 05/22/2020 0822   ANC 400.  Assessment #1 metastatic lung adenocarcinoma -managed at [facility] #2 chemotherapy related pancytopenia-last chemotherapy carboplatin/Alimta/Keytruda on 3/25.  Unclear if patient received G-CSF. #3 symptomatic anemia hemoglobin 6.5 status post 2 units of PRBCs hemoglobin now 8.4 #4 thrombocytopenia.  Patient is about 13 days post chemotherapy.  Platelets have not nadired yet and are still dropping at 13k #5 severe neutropenia related to chemotherapy his ANC is down to 400 and does not seem to have nadired yet.  Overall his pancytopenia is also worse due to the presence of liver cirrhosis which would be an additional risk factor as well as being on psychotropics which could add to his neutropenia. Plan -Given severe neutropenia and his other associated medication and conditions that could worsen bone marrow suppression and his age of more than 63 and aggressive aggressive treatment -ordered Granix daily until  his [place] is more than 1000 -Transfuse PRBC for hemoglobin less than 7.5 or if symptomatic -Transfuse platelets prophylactically if platelets less than 10k or if bleeding. -Would recommend starting the patient on B complex p.o. daily to address any Alimta related bone marrow suppression and also giving vitamin B12 subcu daily x3. -Management of psychotropics including the Zyprexa and pregabalin per hospital medicine. -Please call oncology if any additional questions. -full consult note will followup  Sullivan Lone MD MS

## 2020-05-22 NOTE — Progress Notes (Signed)
PROGRESS NOTE  Andrew Ward  DOB: February 09, 1946  PCP: [name], DO YNW:[id number]  DOA: 05/21/2020  LOS: 0 days   Chief Complaint  Patient presents with  . Hemoglobin    Hgb 6.5, platelets 24, wbc 1.9.    Brief narrative: Andrew Ward is a 75 y.o. 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  Patient is a long-term resident at [facility]. Patient was sent to the ED on 4/6 for pancytopenia. He was diagnosed with adenocarcinoma of the lung in 2016, initially treated with lobectomy but in the past few months, patient developed recurrent metastatic disease for which he is receiving chemotherapy at [facility].  Last chemo was on 05/09/2020. Routine blood work at the SNF on 4/6 showed pancytopenia with hemoglobin low at 6.5, platelet count low at 24 and white count low at 1.9.  Patient was hence sent to the ED for further evaluation management  ED physician discussed the case with oncologist Dr. Morene Rankins who recommended that patient can stay at [facility] long for inpatient care and does not need to be transferred to [facility]. As recommended, patient was given 2 units of PRBC transfusion Admitted to hospitalist service for further evaluation and management.  Subjective: Patient was seen and examined this morning. Elderly Caucasian male.  Lying down in bed.  Not in distress.  Has dementia at baseline but able to answer simple questions.  Assessment/Plan: Pancytopenia due to chemotherapy -Regular care with oncologist at [facility]. -Dr. Morene Rankins was called by ED physician and as recommended, patient was given 2 units of PRBCs. -Hemoglobin improved from 6.5 to 8.4.  However platelet count is dropping, it is 13,000 this morning. -No active bleeding.  Defer to oncology. Recent Labs  Lab  05/21/20 1719 05/22/20 0822  WBC 2.1* 1.5*  NEUTROABS 0.7* 0.4*  HGB 6.5* 8.4*  HCT 19.7* 25.5*  MCV 90.8 85.3  PLT 21* 13*   Adenocarcinoma of lung -Initially diagnosed in 2016 and managed with right upper lobe lobectomy -Unfortunately the past several months patient is developed bilateral pulmonary nodules consistent with recurrent metastatic disease -Patient currently receiving current chemotherapy regimen including Keytruda, carboplatinum and Altima since 03/21/2020 with [facility] health oncology. -Close outpatient follow-up with patient's oncologist for discussion about adjustments to chemotherapy regimen at time of discharge.  Dementia without behavioral disturbance -Currently remains alert, awake, oriented to place and person. -Monitor mental status change.  COPD -Not in exacerbation. -Continue home regimen of inhalers, bronchodilators: Advair Diskus, albuterol, Spiriva  Essential hypertension -Blood pressure controlled on Toprol 50 mg daily, lisinopril 5 mg daily  Type 2 diabetes mellitus with hyperglycemia -A1c 8.1 on 4/6 -Home meds include Levemir 12 units at bedtime, sliding scale insulin and Metformin 1000 mg twice daily at home.  Currently Metformin on hold.  Continue Lantus 20 units daily, sliding scale insulin with Accu-Cheks. Recent Labs  Lab 05/21/20 2204 05/22/20 0746 05/22/20 1131  GLUCAP 164* 138* 146*   Hyperlipidemia -Continue statin  Hypomagnesemia -Improved with replacement. 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  Dementia Anxiety/depression -Home meds include multiple mood altering medications including Xanax 1 mg at bedtime, buspirone 15 mg  3 times daily, olanzapine 5 mg at bedtime, Lyrica 25 mg daily, Lyrica 50 mg at bedtime, Zoloft 150 mg daily,  trazodone 200 mg at bedtime. -Continue to monitor mental status.  Mobility: Long-term resident. Code Status:   Code Status: Full Code  Nutritional status: Body mass index is 22.37 kg/m.     Diet Order            Diet heart healthy/carb modified Room service appropriate? Yes; Fluid consistency: Thin  Diet effective now                 DVT prophylaxis: SCDs Start: 05/21/20 2101   Antimicrobials:  None Fluid: Getting PRBC and platelet transfusions Consultants: Oncology Family Communication:  None at bedside  Status is: Observation  The patient will require care spanning > 2 midnights and should be moved to inpatient because: Needs blood product transfusion and monitoring  Dispo: The patient is from: SNF              Anticipated d/c is to: SNF              Patient currently is not medically stable to d/c.   Difficult to place patient No       Infusions:  . sodium chloride      Scheduled Meds: . ALPRAZolam  1 mg Oral QHS  . atorvastatin  40 mg Oral QHS  . busPIRone  15 mg Oral TID  . Chlorhexidine Gluconate Cloth  6 each Topical Daily  . fluticasone furoate-vilanterol  1 puff Inhalation Daily  . folic acid  1 mg Oral Daily  . insulin aspart  0-15 Units Subcutaneous TID AC & HS  . insulin detemir  12 Units Subcutaneous QHS  . lidocaine  1 patch Transdermal Q24H  . lisinopril  5 mg Oral Daily  . melatonin  3 mg Oral QHS  . metoprolol succinate  50 mg Oral Daily  . OLANZapine  5 mg Oral QHS  . pregabalin  25 mg Oral Daily  . pregabalin  50 mg Oral QHS  . sertraline  150 mg Oral Daily  . traZODone  200 mg Oral QHS  . umeclidinium bromide  1 puff Inhalation Daily    Antimicrobials: Anti-infectives (From admission, onward)   None      PRN meds: acetaminophen **OR** [DISCONTINUED] acetaminophen, albuterol, ipratropium-albuterol, ondansetron **OR** ondansetron (ZOFRAN) IV, polyethylene glycol   Objective: Vitals:   05/22/20 0550 05/22/20 1002  BP:  130/73 131/75  Pulse: 66 74  Resp: 18 16  Temp: 97.9 F (36.6 C) 97.7 F (36.5 C)  SpO2: 99% 97%    Intake/Output Summary (Last 24 hours) at 05/22/2020 1139 Last data filed at 05/22/2020 1191 Gross per 24 hour  Intake 1762.5 ml  Output 600 ml  Net 1162.5 ml   Filed Weights   05/21/20 1636 05/22/20 0550  Weight: 78 kg 74.8 kg   Weight change:  Body mass index is 22.37 kg/m.   Physical Exam: General exam: Pleasant elderly Caucasian male.  Not in physical distress Skin: No rashes, lesions or ulcers. HEENT: Atraumatic, normocephalic, no obvious bleeding Lungs: Clear to auscultation bilaterally CVS: Regular rate and rhythm, no murmur GI/Abd soft, nontender, nondistended, bowel sound present CNS: Alert, awake, oriented to place and person Psychiatry: Mood appropriate Extremities: No pedal edema, no calf tenderness  Data Review: I have personally reviewed the laboratory data and studies available.  Recent Labs  Lab 05/21/20 1719 05/22/20 0822  WBC 2.1* 1.5*  NEUTROABS 0.7* 0.4*  HGB 6.5* 8.4*  HCT 19.7* 25.5*  MCV 90.8 85.3  PLT 21* 13*   Recent Labs  Lab 05/21/20 1719 05/22/20 0822  NA 137 140  K 4.5 3.7  CL 109 112*  CO2 22 22  GLUCOSE 210* 136*  BUN 17 12  CREATININE 0.84 0.64  CALCIUM 8.8* 8.4*  MG 1.4* 1.9  PHOS 2.5  --     F/u labs ordered Unresulted Labs (From admission, onward)          Start     Ordered   05/21/20 2025  Urinalysis, Complete w Microscopic  Once,   STAT        05/21/20 2024          Signed, Terrilee Croak, MD [facility] 05/22/2020

## 2020-05-23 LAB — CBC WITH DIFFERENTIAL/PLATELET
Abs Immature Granulocytes: 0.01 10*3/uL (ref 0.00–0.07)
Basophils Absolute: 0 10*3/uL (ref 0.0–0.1)
Basophils Relative: 0 %
Eosinophils Absolute: 0.1 10*3/uL (ref 0.0–0.5)
Eosinophils Relative: 4 %
HCT: 24.3 % — ABNORMAL LOW (ref 39.0–52.0)
Hemoglobin: 8.1 g/dL — ABNORMAL LOW (ref 13.0–17.0)
Immature Granulocytes: 1 %
Lymphocytes Relative: 36 %
Lymphs Abs: 0.6 10*3/uL — ABNORMAL LOW (ref 0.7–4.0)
MCH: 28.9 pg (ref 26.0–34.0)
MCHC: 33.3 g/dL (ref 30.0–36.0)
MCV: 86.8 fL (ref 80.0–100.0)
Monocytes Absolute: 0.2 10*3/uL (ref 0.1–1.0)
Monocytes Relative: 13 %
Neutro Abs: 0.8 10*3/uL — ABNORMAL LOW (ref 1.7–7.7)
Neutrophils Relative %: 46 %
Platelets: 12 10*3/uL — CL (ref 150–400)
RBC: 2.8 MIL/uL — ABNORMAL LOW (ref 4.22–5.81)
RDW: 15.6 % — ABNORMAL HIGH (ref 11.5–15.5)
WBC: 1.7 10*3/uL — ABNORMAL LOW (ref 4.0–10.5)
nRBC: 0 % (ref 0.0–0.2)

## 2020-05-23 LAB — BASIC METABOLIC PANEL
Anion gap: 6 (ref 5–15)
BUN: 12 mg/dL (ref 8–23)
CO2: 23 mmol/L (ref 22–32)
Calcium: 8.7 mg/dL — ABNORMAL LOW (ref 8.9–10.3)
Chloride: 113 mmol/L — ABNORMAL HIGH (ref 98–111)
Creatinine, Ser: 0.77 mg/dL (ref 0.61–1.24)
GFR, Estimated: >60 mL/min (ref >60)
Glucose, Bld: 166 mg/dL — ABNORMAL HIGH (ref 70–99)
Potassium: 3.7 mmol/L (ref 3.5–5.1)
Sodium: 142 mmol/L (ref 135–145)

## 2020-05-23 LAB — BPAM RBC
Blood Product Expiration Date: 202205052359
Blood Product Expiration Date: 202205052359
ISSUE DATE / TIME: 202204062304
ISSUE DATE / TIME: 202204070201
Unit Type and Rh: 600
Unit Type and Rh: 600

## 2020-05-23 LAB — TYPE AND SCREEN
ABO/RH(D): A NEG
Antibody Screen: NEGATIVE
Unit division: 0
Unit division: 0

## 2020-05-23 LAB — GLUCOSE, CAPILLARY
Glucose-Capillary: 155 mg/dL — ABNORMAL HIGH (ref 70–99)
Glucose-Capillary: 124 mg/dL — ABNORMAL HIGH (ref 70–99)
Glucose-Capillary: 150 mg/dL — ABNORMAL HIGH (ref 70–99)
Glucose-Capillary: 163 mg/dL — ABNORMAL HIGH (ref 70–99)

## 2020-05-23 LAB — PHOSPHORUS: Phosphorus: 3.1 mg/dL (ref 2.5–4.6)

## 2020-05-23 LAB — MAGNESIUM: Magnesium: 1.5 mg/dL — ABNORMAL LOW (ref 1.7–2.4)

## 2020-05-23 MED ORDER — KETOROLAC TROMETHAMINE 30 MG/ML IJ SOLN
15.0000 mg | Freq: Once | INTRAMUSCULAR | Status: AC
  Administered 2020-05-23: 15 mg via INTRAVENOUS
  Filled 2020-05-23: qty 1

## 2020-05-23 NOTE — TOC Initial Note (Signed)
Transition of Care [facility]) - Initial/Assessment Note    Patient Details  Name: Andrew Ward MRN: [id number] Date of Birth: 1945-04-24  Transition of Care [facility]) CM/SW Contact:    Peterson Mathey, Marjie Skiff, RN Phone Number: 05/23/2020, 1:37 PM  Clinical Narrative:                  Pt is from long term care at [facility] and will go back there at dc. FL2 completed and TOC will follow along.  Expected Discharge Plan: Skilled Nursing Facility Barriers to Discharge: Continued Medical Work up   Expected Discharge Plan and Services Expected Discharge Plan: [place]   Discharge Planning Services: CM Consult   Living arrangements for the past 2 months: [place]                       Prior Living Arrangements/Services Living arrangements for the past 2 months: [place] Lives with:: Facility Resident Patient language and need for interpreter reviewed:: Yes              Criminal Activity/Legal Involvement Pertinent to Current Situation/Hospitalization: No - Comment as needed  Activities of Daily Living Home Assistive Devices/Equipment: Cane (specify quad or straight) ADL Screening (condition at time of admission) Patient's cognitive ability adequate to safely complete daily activities?: Yes Is the patient deaf or have difficulty hearing?: Yes Does the patient have difficulty seeing, even when wearing glasses/contacts?: Yes (slightly fuzzy per pt) Does the patient have difficulty concentrating, remembering, or making decisions?: No Patient able to express need for assistance with ADLs?: Yes Does the patient have difficulty dressing or bathing?: No Independently performs ADLs?: Yes (appropriate for developmental age) Does the patient have difficulty walking or climbing stairs?: Yes Weakness of Legs: Both Weakness of Arms/Hands: Both     Admission diagnosis:  Pneumonia [J18.9] Pancytopenia due to antineoplastic chemotherapy ([place])  [D61.810, T45.1X5A] Pancytopenia ([place]) [B55.974] Antineoplastic chemotherapy induced pancytopenia (CODE) ([place]) [D61.810] Patient Active Problem List   Diagnosis Date Noted  . Pancytopenia due to antineoplastic chemotherapy ([place]) 05/21/2020  . Adenocarcinoma of lung ([place]) 05/21/2020  . Type 2 diabetes mellitus with hyperglycemia, with long-term current use of insulin ([place]) 05/21/2020  . Mixed diabetic hyperlipidemia associated with type 2 diabetes mellitus ([place]) 05/21/2020  . 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 Hypomagnesemia 05/21/2020  . Multiple pulmonary nodules determined by computed tomography of lung 04/21/2018  . Lactic acidosis 10/27/2016  . Dementia without behavioral disturbance ([place]) 10/27/2016  . Diabetes mellitus without complication ([place]) 16/38/4536  . COPD (chronic obstructive pulmonary disease) ([place]) 10/27/2016  . Essential hypertension 10/27/2016  . Bipolar disorder ([place]) 10/27/2016   PCP:  [name], DO Pharmacy:  No Pharmacies Listed    Social Determinants of Health (SDOH) Interventions    Readmission Risk Interventions Readmission Risk Prevention Plan 05/23/2020  Transportation Screening Complete  PCP or Specialist Appt within 3-5 Days Complete  HRI or [place] Complete  Social Work Consult for [place] Planning/Counseling Complete  Palliative Care Screening Not Applicable  Medication Review Press photographer) Complete  Some recent data might be hidden

## 2020-05-23 NOTE — Progress Notes (Signed)
PROGRESS NOTE  Andrew Ward  DOB: 08-30-1945  PCP: [name], DO PYP:[id number]  DOA: 05/21/2020  LOS: 1 day   Chief Complaint  Patient presents with  . Hemoglobin    Hgb 6.5, platelets 24, wbc 1.9.    Brief narrative: Andrew Ward is a 74 y.o. 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  Patient is a long-term resident at [facility]. Patient was sent to the ED on 4/6 for pancytopenia. He was diagnosed with adenocarcinoma of the lung in 2016, initially treated with lobectomy but in the past few months, patient developed recurrent metastatic disease for which he is receiving chemotherapy at [facility].  Last chemo was on 05/09/2020. Routine blood work at the SNF on 4/6 showed pancytopenia with hemoglobin low at 6.5, platelet count low at 24 and white count low at 1.9.  Patient was hence sent to the ED for further evaluation management  ED physician discussed the case with oncologist Dr. Morene Rankins who recommended that patient can stay at [facility] long for inpatient care and does not need to be transferred to [facility]. As recommended, patient was given 2 units of PRBC transfusion Admitted to hospitalist service for further evaluation and management.  Subjective: Patient was seen and examined this morning. Lying on bed.  Not in distress.  No new symptoms. Continues to have pancytopenia.  Assessment/Plan: Pancytopenia due to chemotherapy -Regular care with oncologist at [facility]. -Dr. Morene Rankins was called by ED physician and as recommended, patient was given 2 units of PRBCs. -Hemoglobin improved after transfusion but remains stable today.  Platelet count remains low at 12,000 today. -No active bleeding.  Defer to oncology. Recent Labs  Lab 05/21/20 1719 05/22/20 0822  05/23/20 0516  WBC 2.1* 1.5* 1.7*  NEUTROABS 0.7* 0.4* 0.8*  HGB 6.5* 8.4* 8.1*  HCT 19.7* 25.5* 24.3*  MCV 90.8 85.3 86.8  PLT 21* 13* 12*   Adenocarcinoma of lung -Initially diagnosed in 2016 and managed with right upper lobe lobectomy -Unfortunately the past several months patient is developed bilateral pulmonary nodules consistent with recurrent metastatic disease -Patient currently receiving current chemotherapy regimen including Keytruda, carboplatinum and Altima since 03/21/2020 with [facility] health oncology. -Close outpatient follow-up with patient's oncologist for discussion about adjustments to chemotherapy regimen at time of discharge.  Dementia without behavioral disturbance -Currently remains alert, awake, oriented to place and person. -Monitor mental status change.  COPD -Not in exacerbation. -Continue home regimen of inhalers, bronchodilators: Advair Diskus, albuterol, Spiriva  Essential hypertension -Blood pressure controlled on Toprol 50 mg daily, lisinopril 5 mg daily  Type 2 diabetes mellitus with hyperglycemia -A1c 8.1 on 4/6 -Home meds include Levemir 12 units at bedtime, sliding scale insulin and Metformin 1000 mg twice daily at home.  Currently Metformin on hold.  Continue Lantus 12 units daily, sliding scale insulin with Accu-Cheks. Recent Labs  Lab 05/22/20 1131 05/22/20 1701 05/22/20 2219 05/23/20 0740 05/23/20 1133  GLUCAP 146* 150* 181* 155* 124*   Hyperlipidemia -Continue statin  Hypomagnesemia -Improved with replacement. 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  Dementia Anxiety/depression -Home meds include multiple mood altering  medications including Xanax 1 mg at bedtime, buspirone 15 mg 3 times daily, olanzapine 5 mg  at bedtime, Lyrica 25 mg daily, Lyrica 50 mg at bedtime, Zoloft 150 mg daily, trazodone 200 mg at bedtime. -Continue to monitor mental status.  Mobility: Long-term resident. Code Status:   Code Status: Full Code  Nutritional status: Body mass index is 22.37 kg/m.     Diet Order            Diet heart healthy/carb modified Room service appropriate? Yes; Fluid consistency: Thin  Diet effective now                 DVT prophylaxis: SCDs Start: 05/21/20 2101   Antimicrobials:  None Fluid: Getting PRBC and platelet transfusions Consultants: Oncology Family Communication:  None at bedside  Status is: Inpatient  Remains inpatient because of persistent low blood count  Dispo: The patient is from: SNF              Anticipated d/c is to: SNF              Patient currently is not medically stable to d/c.   Difficult to place patient No       Infusions:  . sodium chloride      Scheduled Meds: . ALPRAZolam  1 mg Oral QHS  . atorvastatin  40 mg Oral QHS  . B-complex with vitamin C  1 tablet Oral Daily  . busPIRone  15 mg Oral TID  . Chlorhexidine Gluconate Cloth  6 each Topical Daily  . cyanocobalamin  1,000 mcg Subcutaneous Daily  . fluticasone furoate-vilanterol  1 puff Inhalation Daily  . folic acid  1 mg Oral Daily  . insulin aspart  0-15 Units Subcutaneous TID AC & HS  . insulin detemir  12 Units Subcutaneous QHS  . lidocaine  1 patch Transdermal Q24H  . lisinopril  5 mg Oral Daily  . melatonin  3 mg Oral QHS  . metoprolol succinate  50 mg Oral Daily  . OLANZapine  5 mg Oral QHS  . pregabalin  25 mg Oral Daily  . pregabalin  50 mg Oral QHS  . sertraline  150 mg Oral Daily  . Tbo-filgastrim (GRANIX) SQ  480 mcg Subcutaneous q1800  . traZODone  200 mg Oral QHS  . umeclidinium bromide  1 puff Inhalation Daily    Antimicrobials: Anti-infectives (From admission, onward)   None      PRN meds: acetaminophen **OR** [DISCONTINUED] acetaminophen, albuterol,  ipratropium-albuterol, ondansetron **OR** ondansetron (ZOFRAN) IV, polyethylene glycol   Objective: Vitals:   05/23/20 0533 05/23/20 1326  BP: 125/65 104/62  Pulse: 70 77  Resp: 16 16  Temp: 98.2 F (36.8 C) 97.7 F (36.5 C)  SpO2: 96% 96%    Intake/Output Summary (Last 24 hours) at 05/23/2020 1455 Last data filed at 05/23/2020 1110 Gross per 24 hour  Intake 940 ml  Output 300 ml  Net 640 ml   Filed Weights   05/21/20 1636 05/22/20 0550  Weight: 78 kg 74.8 kg   Weight change:  Body mass index is 22.37 kg/m.   Physical Exam: General exam: Pleasant elderly Caucasian male.  Not in physical distress Skin: No rashes, lesions or ulcers. HEENT: Atraumatic, normocephalic, no obvious bleeding Lungs: Clear to auscultation bilaterally CVS: Regular rate and rhythm, no murmur GI/Abd soft, nontender, nondistended, bowel sound present CNS: Sleeping, arousable on verbal command.  Knows he is in the hospital.  Baseline dementia Psychiatry: Mood appropriate Extremities: No pedal edema,  no calf tenderness  Data Review: I have personally reviewed the laboratory data and studies available.  Recent Labs  Lab 05/21/20 1719 05/22/20 0822 05/23/20 0516  WBC 2.1* 1.5* 1.7*  NEUTROABS 0.7* 0.4* 0.8*  HGB 6.5* 8.4* 8.1*  HCT 19.7* 25.5* 24.3*  MCV 90.8 85.3 86.8  PLT 21* 13* 12*   Recent Labs  Lab 05/21/20 1719 05/22/20 0822 05/23/20 0516  NA 137 140 142  K 4.5 3.7 3.7  CL 109 112* 113*  CO2 22 22 23   GLUCOSE 210* 136* 166*  BUN 17 12 12   CREATININE 0.84 0.64 0.77  CALCIUM 8.8* 8.4* 8.7*  MG 1.4* 1.9 1.5*  PHOS 2.5  --  3.1    F/u labs ordered Unresulted Labs (From admission, onward)          Start     Ordered   05/23/20 0500  CBC with Differential/Platelet  Daily,   R      05/22/20 1140   05/23/20 1448  Basic metabolic panel  Daily,   R      05/22/20 1140   05/21/20 2025  Urinalysis, Complete w Microscopic  Once,   STAT        05/21/20 2024           Signed, Terrilee Croak, MD [facility] 05/23/2020

## 2020-05-23 NOTE — Progress Notes (Signed)
Brief Oncology Note:  CBC results from today noted below. He was started on Granix 480 mcg daily X5 days on 4/7.  Received units PRBCs on 05/22/2020.  CBC    Component Value Date/Time   WBC 1.7 (L) 05/23/2020 0516   RBC 2.80 (L) 05/23/2020 0516   HGB 8.1 (L) 05/23/2020 0516   HCT 24.3 (L) 05/23/2020 0516   PLT 12 (LL) 05/23/2020 0516   MCV 86.8 05/23/2020 0516   MCH 28.9 05/23/2020 0516   MCHC 33.3 05/23/2020 0516   RDW 15.6 (H) 05/23/2020 0516   LYMPHSABS 0.6 (L) 05/23/2020 0516   MONOABS 0.2 05/23/2020 0516   EOSABS 0.1 05/23/2020 0516   BASOSABS 0.0 05/23/2020 0516   Assessment #1 metastatic lung adenocarcinoma -managed at [facility] #2 chemotherapy related pancytopenia-last chemotherapy carboplatin/Alimta/Keytruda on 3/25.  Unclear if patient received G-CSF. #3 symptomatic anemia hemoglobin 6.5 status post 2 units of PRBCs hemoglobin now 8.1 (stable) #4 thrombocytopenia.  Patient is about 14 days post chemotherapy.  Platelets have likely nadired at this point and are 12,000 today #5 severe neutropenia related to chemotherapy his [place] dropped to 400 - Up to 800 today after starting Granix  Overall his pancytopenia is also worse due to the presence of liver cirrhosis which would be an additional risk factor as well as being on psychotropics which could add to his neutropenia.  Plan -Given severe neutropenia and his other associated medication and conditions that could worsen bone marrow suppression and his age of more than 77 and aggressive aggressive treatment  -ordered Granix daily until his [place] is more than 1000 -Transfuse PRBC for hemoglobin less than 7.5 or if symptomatic -Transfuse platelets prophylactically if platelets less than 10k or if bleeding. -Would recommend starting the patient on B complex p.o. daily to address any Alimta related bone marrow suppression and also giving vitamin B12 subcu daily x3. -Management of psychotropics including the Zyprexa and  pregabalin per hospital medicine. -Left VM with primary oncologist office on 4/7 - have not heard back. Called again today and spoke with the office.  I updated them on his condition.  They have plans to recheck the patient's blood counts on 4/14 and will manage transfusion growth factor support thereafter. -The patient can discharge on Monday after receiving Granix from our standpoint.  He already has outpatient follow-up scheduled with his primary oncologist on 4/14.  Mikey Bussing, DNP, AGPCNP-BC, AOCNP

## 2020-05-23 NOTE — NC FL2 (Signed)
[place] LEVEL OF CARE SCREENING TOOL     IDENTIFICATION  Patient Name: Andrew Ward Birthdate: 1946-02-10 Sex: male Admission Date (Current Location): 05/21/2020  [facility] and [place] Number:  Herbalist and Address:  [facility],  [place] [address] [employer]      Provider Number: [id number]  Attending Physician Name and Address:  Terrilee Croak, MD  Relative Name and Phone Number:       Current Level of Care: Hospital Recommended Level of Care: [place] Prior Approval Number:    Date Approved/Denied:   PASRR Number: [id number]  Discharge Plan: SNF    Current Diagnoses: Patient Active Problem List   Diagnosis Date Noted  . Pancytopenia due to antineoplastic chemotherapy ([place]) 05/21/2020  . Adenocarcinoma of lung ([place]) 05/21/2020  . Type 2 diabetes mellitus with hyperglycemia, with long-term current use of insulin ([place]) 05/21/2020  . Mixed diabetic hyperlipidemia associated with type 2 diabetes mellitus ([place]) 05/21/2020  . 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 Hypomagnesemia 05/21/2020  . Multiple pulmonary nodules determined by computed tomography of lung 04/21/2018  . Lactic acidosis 10/27/2016  . Dementia without behavioral disturbance ([place]) 10/27/2016  . Diabetes mellitus without complication ([place]) 93/71/6967  . COPD (chronic obstructive pulmonary disease) ([place]) 10/27/2016  . Essential hypertension 10/27/2016  . Bipolar disorder ([place]) 10/27/2016    Orientation RESPIRATION BLADDER Height & Weight     Self,Situation,Place  Normal Continent Weight: 74.8 kg Height:  6' (182.9 cm)  BEHAVIORAL SYMPTOMS/MOOD NEUROLOGICAL BOWEL NUTRITION STATUS      Continent Diet (Heart Healthy Carb Modified)  AMBULATORY STATUS COMMUNICATION OF NEEDS Skin   Extensive Assist Verbally Normal                       Personal Care Assistance Level of Assistance   Bathing,Feeding,Dressing Bathing Assistance: Limited assistance Feeding assistance: Limited assistance Dressing Assistance: Limited assistance     Functional Limitations Info  Sight,Hearing,Speech Sight Info: Adequate Hearing Info: Adequate Speech Info: Adequate    SPECIAL CARE FACTORS FREQUENCY                       Contractures Contractures Info: Not present    Additional Factors Info  Code Status,Allergies Code Status Info: Full Allergies Info: Fentanyl, Atorvastatin, Duloxetine           Current Medications (05/23/2020):  This is the current hospital active medication list Current Facility-Administered Medications  Medication Dose Route Frequency Provider Last Rate Last Admin  . 0.9 %  sodium chloride infusion  10 mL/hr Intravenous Once Vernelle Emerald, MD      . acetaminophen (TYLENOL) tablet 650 mg  650 mg Oral Q6H PRN Vernelle Emerald, MD   650 mg at 05/21/20 2229  . albuterol (VENTOLIN HFA) 108 (90 Base) MCG/ACT inhaler 2 puff  2 puff Inhalation Q4H PRN Shalhoub, Sherryll Burger, MD      . ALPRAZolam Duanne Moron) tablet 1 mg  1 mg Oral QHS Shalhoub, Sherryll Burger, MD   1 mg at 05/22/20 2210  . atorvastatin (LIPITOR) tablet 40 mg  40 mg Oral QHS Vernelle Emerald, MD   40 mg at 05/22/20 2210  . B-complex with vitamin C tablet 1 tablet  1 tablet Oral Daily Brunetta Genera, MD   1 tablet at 05/23/20 [phone]  . busPIRone (BUSPAR) tablet 15 mg  15 mg Oral TID Vernelle Emerald, MD  15 mg at 05/23/20 0956  . Chlorhexidine Gluconate Cloth 2 % PADS 6 each  6 each Topical Daily Shalhoub, Sherryll Burger, MD   6 each at 05/23/20 [phone]  . cyanocobalamin ((VITAMIN B-12)) injection 1,000 mcg  1,000 mcg Subcutaneous Daily Brunetta Genera, MD   1,000 mcg at 05/23/20 0957  . fluticasone furoate-vilanterol (BREO ELLIPTA) 200-25 MCG/INH 1 puff  1 puff Inhalation Daily Shalhoub, Sherryll Burger, MD      . folic acid (FOLVITE) tablet 1 mg  1 mg Oral Daily Shalhoub, Sherryll Burger, MD   1 mg at 05/23/20  0956  . insulin aspart (novoLOG) injection 0-15 Units  0-15 Units Subcutaneous TID AC & HS Shalhoub, Sherryll Burger, MD   2 Units at 05/23/20 1256  . insulin detemir (LEVEMIR) injection 12 Units  12 Units Subcutaneous QHS Vernelle Emerald, MD   12 Units at 05/22/20 2238  . ipratropium-albuterol (DUONEB) 0.5-2.5 (3) MG/3ML nebulizer solution 3 mL  3 mL Inhalation Q4H PRN Shalhoub, Sherryll Burger, MD      . lidocaine (LIDODERM) 5 % 1 patch  1 patch Transdermal Q24H Shalhoub, Sherryll Burger, MD   1 patch at 05/22/20 2252  . lisinopril (ZESTRIL) tablet 5 mg  5 mg Oral Daily Shalhoub, Sherryll Burger, MD   5 mg at 05/23/20 0956  . melatonin tablet 3 mg  3 mg Oral QHS Shalhoub, Sherryll Burger, MD   3 mg at 05/22/20 2209  . metoprolol succinate (TOPROL-XL) 24 hr tablet 50 mg  50 mg Oral Daily Shalhoub, Sherryll Burger, MD   50 mg at 05/23/20 0956  . OLANZapine (ZYPREXA) tablet 5 mg  5 mg Oral QHS Shalhoub, Sherryll Burger, MD   5 mg at 05/22/20 2210  . ondansetron (ZOFRAN) tablet 4 mg  4 mg Oral Q6H PRN Shalhoub, Sherryll Burger, MD       Or  . ondansetron [facility]) injection 4 mg  4 mg Intravenous Q6H PRN Shalhoub, Sherryll Burger, MD      . polyethylene glycol (MIRALAX / GLYCOLAX) packet 17 g  17 g Oral Daily PRN Shalhoub, Sherryll Burger, MD      . pregabalin (LYRICA) capsule 25 mg  25 mg Oral Daily Shalhoub, Sherryll Burger, MD   25 mg at 05/23/20 0956  . pregabalin (LYRICA) capsule 50 mg  50 mg Oral QHS Shalhoub, Sherryll Burger, MD   50 mg at 05/22/20 2210  . sertraline (ZOLOFT) tablet 150 mg  150 mg Oral Daily Shalhoub, Sherryll Burger, MD   150 mg at 05/23/20 0956  . Tbo-Filgrastim (GRANIX) injection 480 mcg  480 mcg Subcutaneous q1800 Brunetta Genera, MD   480 mcg at 05/22/20 1947  . traZODone (DESYREL) tablet 200 mg  200 mg Oral QHS Shalhoub, Sherryll Burger, MD   200 mg at 05/22/20 2209  . umeclidinium bromide (INCRUSE ELLIPTA) 62.5 MCG/INH 1 puff  1 puff Inhalation Daily Shalhoub, Sherryll Burger, MD         Discharge Medications: Please see discharge summary for a list of  discharge medications.  Relevant Imaging Results:  Relevant Lab Results:   Additional Information [id number]  Kree Rafter, Marjie Skiff, RN

## 2020-05-24 LAB — BASIC METABOLIC PANEL
Anion gap: 7 (ref 5–15)
BUN: 16 mg/dL (ref 8–23)
CO2: 23 mmol/L (ref 22–32)
Calcium: 8.8 mg/dL — ABNORMAL LOW (ref 8.9–10.3)
Chloride: 108 mmol/L (ref 98–111)
Creatinine, Ser: 0.88 mg/dL (ref 0.61–1.24)
GFR, Estimated: >60 mL/min (ref >60)
Glucose, Bld: 135 mg/dL — ABNORMAL HIGH (ref 70–99)
Potassium: 3.4 mmol/L — ABNORMAL LOW (ref 3.5–5.1)
Sodium: 138 mmol/L (ref 135–145)

## 2020-05-24 LAB — CBC WITH DIFFERENTIAL/PLATELET
Abs Immature Granulocytes: 0.26 10*3/uL — ABNORMAL HIGH (ref 0.00–0.07)
Basophils Absolute: 0 10*3/uL (ref 0.0–0.1)
Basophils Relative: 0 %
Eosinophils Absolute: 0.1 10*3/uL (ref 0.0–0.5)
Eosinophils Relative: 4 %
HCT: 25.2 % — ABNORMAL LOW (ref 39.0–52.0)
Hemoglobin: 8.4 g/dL — ABNORMAL LOW (ref 13.0–17.0)
Immature Granulocytes: 10 %
Lymphocytes Relative: 36 %
Lymphs Abs: 1 10*3/uL (ref 0.7–4.0)
MCH: 29.1 pg (ref 26.0–34.0)
MCHC: 33.3 g/dL (ref 30.0–36.0)
MCV: 87.2 fL (ref 80.0–100.0)
Monocytes Absolute: 0.3 10*3/uL (ref 0.1–1.0)
Monocytes Relative: 12 %
Neutro Abs: 1.1 10*3/uL — ABNORMAL LOW (ref 1.7–7.7)
Neutrophils Relative %: 38 %
Platelets: 17 10*3/uL — CL (ref 150–400)
RBC: 2.89 MIL/uL — ABNORMAL LOW (ref 4.22–5.81)
RDW: 15.6 % — ABNORMAL HIGH (ref 11.5–15.5)
WBC: 2.8 10*3/uL — ABNORMAL LOW (ref 4.0–10.5)
nRBC: 0.7 % — ABNORMAL HIGH (ref 0.0–0.2)

## 2020-05-24 LAB — GLUCOSE, CAPILLARY
Glucose-Capillary: 153 mg/dL — ABNORMAL HIGH (ref 70–99)
Glucose-Capillary: 145 mg/dL — ABNORMAL HIGH (ref 70–99)
Glucose-Capillary: 151 mg/dL — ABNORMAL HIGH (ref 70–99)
Glucose-Capillary: 198 mg/dL — ABNORMAL HIGH (ref 70–99)

## 2020-05-24 NOTE — Progress Notes (Signed)
PROGRESS NOTE  Andrew Ward  DOB: Mar 19, 1945  PCP: [name], DO JEH:[id number]  DOA: 05/21/2020  LOS: 2 days   Chief Complaint  Patient presents with  . Hemoglobin    Hgb 6.5, platelets 24, wbc 1.9.    Brief narrative: Andrew Ward is a 75 y.o. male with PMH significant for dementia, hyperlipidemia, hypertension, hepatitis C (S/P Tx) with hepatitis C cirrhosis, COPD and adenocarcinoma of the right lung currently receiving chemotherapy under the care of oncology at [facility].  Patient is a long-term resident at [facility]. Patient was sent to the ED on 4/6 for pancytopenia. He was diagnosed with adenocarcinoma of the lung in 2016, initially treated with lobectomy but in the past few months, patient developed recurrent metastatic disease for which he is receiving chemotherapy at [facility].  Last chemo was on 05/09/2020. Routine blood work at the SNF on 4/6 showed pancytopenia with hemoglobin low at 6.5, platelet count low at 24 and white count low at 1.9.  Patient was hence sent to the ED for further evaluation management  ED physician discussed the case with oncologist Dr. Morene Rankins who recommended that patient can stay at [facility] long for inpatient care and does not need to be transferred to [facility]. As recommended, patient was given 2 units of PRBC transfusion Admitted to hospitalist service for further evaluation and management.  Subjective: Patient was seen and examined this morning. Sitting up at the edge of the bed.  Not in distress. Pancytopenia improving  Assessment/Plan: Pancytopenia due to chemotherapy -Regular care with oncologist at [facility]. -Dr. Morene Rankins was called by ED physician and as recommended, patient was given 2 units of PRBCs. -Currently on Granix daily, WBC count improving.  Platelet count improved to 70,000 today.  Hemoglobin improving, 8.4 today. Recent Labs  Lab 05/21/20 1719 05/22/20 0822 05/23/20 0516 05/24/20 0508  WBC 2.1*  1.5* 1.7* 2.8*  NEUTROABS 0.7* 0.4* 0.8* 1.1*  HGB 6.5* 8.4* 8.1* 8.4*  HCT 19.7* 25.5* 24.3* 25.2*  MCV 90.8 85.3 86.8 87.2  PLT 21* 13* 12* 17*   Adenocarcinoma of lung -Initially diagnosed in 2016 and managed with right upper lobe lobectomy -Unfortunately the past several months patient is developed bilateral pulmonary nodules consistent with recurrent metastatic disease -Patient currently receiving current chemotherapy regimen including Keytruda, carboplatinum and Altima since 03/21/2020 with [facility] health oncology. -Close outpatient follow-up with patient's oncologist for discussion about adjustments to chemotherapy regimen at time of discharge.  Dementia without behavioral disturbance -Currently remains alert, awake, oriented to place and person. -Monitor mental status change.  COPD -Not in exacerbation. -Continue home regimen of inhalers, bronchodilators: Advair Diskus, albuterol, Spiriva  Essential hypertension -Blood pressure controlled on Toprol 50 mg daily, lisinopril 5 mg daily  Type 2 diabetes mellitus with hyperglycemia -A1c 8.1 on 4/6 -Home meds include Levemir 12 units at bedtime, sliding scale insulin and Metformin 1000 mg twice daily at home.  Currently Metformin on hold.  Continue Lantus 12 units daily, sliding scale insulin with Accu-Cheks. Recent Labs  Lab 05/23/20 1133 05/23/20 1629 05/23/20 2137 05/24/20 0801 05/24/20 1147  GLUCAP 124* 150* 163* 153* 145*   Hyperlipidemia -Continue statin  Hypomagnesemia -Improved with replacement. 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  Dementia Anxiety/depression -Home meds include multiple mood altering medications including Xanax 1 mg at bedtime, buspirone 15  mg 3 times daily, olanzapine 5 mg at bedtime, Lyrica 25 mg daily, Lyrica 50 mg at bedtime, Zoloft 150 mg daily, trazodone 200 mg at bedtime. -Continue to monitor mental status.  Mobility: Long-term resident. Code Status:   Code Status: Full Code  Nutritional status: Body mass index is 22.37 kg/m.     Diet Order            Diet heart healthy/carb modified Room service appropriate? Yes; Fluid consistency: Thin  Diet effective now                 DVT prophylaxis: SCDs Start: 05/21/20 2101   Antimicrobials:  None Fluid: Getting PRBC and platelet transfusions Consultants: Oncology Family Communication:  None at bedside  Status is: Inpatient  Remains inpatient because of persistent low blood count  Dispo: The patient is from: SNF              Anticipated d/c is to: SNF              Patient currently is not medically stable to d/c.   Difficult to place patient No       Infusions:    Scheduled Meds: . ALPRAZolam  1 mg Oral QHS  . atorvastatin  40 mg Oral QHS  . B-complex with vitamin C  1 tablet Oral Daily  . busPIRone  15 mg Oral TID  . Chlorhexidine Gluconate Cloth  6 each Topical Daily  . fluticasone furoate-vilanterol  1 puff Inhalation Daily  . folic acid  1 mg Oral Daily  . insulin aspart  0-15 Units Subcutaneous TID AC & HS  . insulin detemir  12 Units Subcutaneous QHS  . lidocaine  1 patch Transdermal Q24H  . lisinopril  5 mg Oral Daily  . melatonin  3 mg Oral QHS  . metoprolol succinate  50 mg Oral Daily  . OLANZapine  5 mg Oral QHS  . pregabalin  25 mg Oral Daily  . pregabalin  50 mg Oral QHS  . sertraline  150 mg Oral Daily  . Tbo-filgastrim (GRANIX) SQ  480 mcg Subcutaneous q1800  . traZODone  200 mg Oral QHS  . umeclidinium bromide  1 puff Inhalation Daily    Antimicrobials: Anti-infectives (From admission, onward)   None      PRN meds: acetaminophen **OR** [DISCONTINUED] acetaminophen, albuterol, ipratropium-albuterol, ondansetron **OR**  ondansetron (ZOFRAN) IV, polyethylene glycol   Objective: Vitals:   05/24/20 1020 05/24/20 1401  BP: 135/76 118/70  Pulse: 72 70  Resp:  15  Temp:  97.9 F (36.6 C)  SpO2:  93%    Intake/Output Summary (Last 24 hours) at 05/24/2020 1438 Last data filed at 05/24/2020 0940 Gross per 24 hour  Intake 360 ml  Output 100 ml  Net 260 ml   Filed Weights   05/21/20 1636 05/22/20 0550  Weight: 78 kg 74.8 kg   Weight change:  Body mass index is 22.37 kg/m.   Physical Exam: General exam: Pleasant elderly Caucasian male.  Not in physical distress Skin: No rashes, lesions or ulcers. HEENT: Atraumatic, normocephalic, no obvious bleeding Lungs: Clear to auscultation bilaterally CVS: Regular rate and rhythm, no murmur GI/Abd soft, nontender, nondistended, bowel sound present CNS: Sleeping, arousable on verbal command.  Knows he is in the hospital.  Baseline dementia Psychiatry: Mood appropriate Extremities: No pedal edema, no calf tenderness  Data  Review: I have personally reviewed the laboratory data and studies available.  Recent Labs  Lab 05/21/20 1719 05/22/20 0822 05/23/20 0516 05/24/20 0508  WBC 2.1* 1.5* 1.7* 2.8*  NEUTROABS 0.7* 0.4* 0.8* 1.1*  HGB 6.5* 8.4* 8.1* 8.4*  HCT 19.7* 25.5* 24.3* 25.2*  MCV 90.8 85.3 86.8 87.2  PLT 21* 13* 12* 17*   Recent Labs  Lab 05/21/20 1719 05/22/20 0822 05/23/20 0516 05/24/20 0508  NA 137 140 142 138  K 4.5 3.7 3.7 3.4*  CL 109 112* 113* 108  CO2 22 22 23 23   GLUCOSE 210* 136* 166* 135*  BUN 17 12 12 16   CREATININE 0.84 0.64 0.77 0.88  CALCIUM 8.8* 8.4* 8.7* 8.8*  MG 1.4* 1.9 1.5*  --   PHOS 2.5  --  3.1  --     F/u labs ordered Unresulted Labs (From admission, onward)          Start     Ordered   05/23/20 0500  CBC with Differential/Platelet  Daily,   R      05/22/20 1140   05/23/20 6629  Basic metabolic panel  Daily,   R      05/22/20 1140   05/21/20 2025  Urinalysis, Complete w Microscopic  Once,   STAT         05/21/20 2024          Signed, Terrilee Croak, MD [facility] 05/24/2020

## 2020-05-25 LAB — CBC WITH DIFFERENTIAL/PLATELET
Abs Immature Granulocytes: 1.04 10*3/uL — ABNORMAL HIGH (ref 0.00–0.07)
Basophils Absolute: 0 10*3/uL (ref 0.0–0.1)
Basophils Relative: 1 %
Eosinophils Absolute: 0.1 10*3/uL (ref 0.0–0.5)
Eosinophils Relative: 4 %
HCT: 25.9 % — ABNORMAL LOW (ref 39.0–52.0)
Hemoglobin: 8.5 g/dL — ABNORMAL LOW (ref 13.0–17.0)
Immature Granulocytes: 25 %
Lymphocytes Relative: 27 %
Lymphs Abs: 1.1 10*3/uL (ref 0.7–4.0)
MCH: 28.6 pg (ref 26.0–34.0)
MCHC: 32.8 g/dL (ref 30.0–36.0)
MCV: 87.2 fL (ref 80.0–100.0)
Monocytes Absolute: 0.6 10*3/uL (ref 0.1–1.0)
Monocytes Relative: 16 %
Neutro Abs: 1 10*3/uL — ABNORMAL LOW (ref 1.7–7.7)
Neutrophils Relative %: 27 %
Platelets: 26 10*3/uL — CL (ref 150–400)
RBC: 2.97 MIL/uL — ABNORMAL LOW (ref 4.22–5.81)
RDW: 15.8 % — ABNORMAL HIGH (ref 11.5–15.5)
WBC: 3.9 10*3/uL — ABNORMAL LOW (ref 4.0–10.5)
nRBC: 1 % — ABNORMAL HIGH (ref 0.0–0.2)

## 2020-05-25 LAB — GLUCOSE, CAPILLARY
Glucose-Capillary: 170 mg/dL — ABNORMAL HIGH (ref 70–99)
Glucose-Capillary: 164 mg/dL — ABNORMAL HIGH (ref 70–99)
Glucose-Capillary: 110 mg/dL — ABNORMAL HIGH (ref 70–99)
Glucose-Capillary: 213 mg/dL — ABNORMAL HIGH (ref 70–99)

## 2020-05-25 LAB — BASIC METABOLIC PANEL
Anion gap: 6 (ref 5–15)
BUN: 11 mg/dL (ref 8–23)
CO2: 24 mmol/L (ref 22–32)
Calcium: 8.9 mg/dL (ref 8.9–10.3)
Chloride: 111 mmol/L (ref 98–111)
Creatinine, Ser: 0.73 mg/dL (ref 0.61–1.24)
GFR, Estimated: >60 mL/min (ref >60)
Glucose, Bld: 172 mg/dL — ABNORMAL HIGH (ref 70–99)
Potassium: 3.5 mmol/L (ref 3.5–5.1)
Sodium: 141 mmol/L (ref 135–145)

## 2020-05-25 MED ORDER — HEPARIN SOD (PORK) LOCK FLUSH 100 UNIT/ML IV SOLN
500.0000 [IU] | INTRAVENOUS | Status: AC | PRN
  Administered 2020-05-26: 500 [IU]
  Filled 2020-05-25: qty 5

## 2020-05-25 NOTE — Progress Notes (Signed)
Linus Orn Covid-19 test done and sent to lab.

## 2020-05-25 NOTE — Progress Notes (Signed)
PROGRESS NOTE  Andrew Ward  DOB: 01/16/1946  PCP: [name], DO DVV:[id number]  DOA: 05/21/2020  LOS: 3 days   Chief Complaint  Patient presents with  . Hemoglobin    Hgb 6.5, platelets 24, wbc 1.9.    Brief narrative: Andrew Ward is a 75 y.o. male with PMH significant for dementia, hyperlipidemia, hypertension, hepatitis C (S/P Tx) with hepatitis C cirrhosis, COPD and adenocarcinoma of the right lung currently receiving chemotherapy under the care of oncology at [facility].  Patient is a long-term resident at [facility]. Patient was sent to the ED on 4/6 for pancytopenia. He was diagnosed with adenocarcinoma of the lung in 2016, initially treated with lobectomy but in the past few months, patient developed recurrent metastatic disease for which he is receiving chemotherapy at [facility].  Last chemo was on 05/09/2020. Routine blood work at the SNF on 4/6 showed pancytopenia with hemoglobin low at 6.5, platelet count low at 24 and white count low at 1.9.  Patient was hence sent to the ED for further evaluation management  ED physician discussed the case with oncologist Dr. Morene Rankins who recommended that patient can stay at [facility] long for inpatient care and does not need to be transferred to [facility]. As recommended, patient was given 2 units of PRBC transfusion Admitted to hospitalist service for further evaluation and management.  Subjective: Patient was seen and examined this morning. Lying on bed.  Not in distress.  Calm.  No bleeding.  Assessment/Plan: Pancytopenia due to chemotherapy -Regular care with oncologist at [facility]. -Dr. Morene Rankins was called by ED physician and as recommended, patient was given 2 units of PRBCs. -Currently on Granix daily, WBC count improving.  Platelet count improved to 26,000 today.  Hemoglobin improving, 8.5 today. -If continues to improve, we may be able to discharge him in 1 to 2 days Recent Labs  Lab 05/21/20 1719  05/22/20 0822 05/23/20 0516 05/24/20 0508 05/25/20 0700  WBC 2.1* 1.5* 1.7* 2.8* 3.9*  NEUTROABS 0.7* 0.4* 0.8* 1.1* 1.0*  HGB 6.5* 8.4* 8.1* 8.4* 8.5*  HCT 19.7* 25.5* 24.3* 25.2* 25.9*  MCV 90.8 85.3 86.8 87.2 87.2  PLT 21* 13* 12* 17* 26*   Adenocarcinoma of lung -Initially diagnosed in 2016 and managed with right upper lobe lobectomy -Unfortunately the past several months patient is developed bilateral pulmonary nodules consistent with recurrent metastatic disease -Patient currently receiving current chemotherapy regimen including Keytruda, carboplatinum and Altima since 03/21/2020 with [facility] health oncology. -Close outpatient follow-up with patient's oncologist for discussion about adjustments to chemotherapy regimen at time of discharge.  Dementia without behavioral disturbance -Currently remains alert, awake, oriented to place and person. -Monitor mental status change.  COPD -Not in exacerbation. -Continue home regimen of inhalers, bronchodilators: Advair Diskus, albuterol, Spiriva  Essential hypertension -Blood pressure controlled on Toprol 50 mg daily, lisinopril 5 mg daily  Type 2 diabetes mellitus with hyperglycemia -A1c 8.1 on 4/6 -Home meds include Levemir 12 units at bedtime, sliding scale insulin and Metformin 1000 mg twice daily at home.  Currently Metformin on hold.  Continue Lantus 12 units daily, sliding scale insulin with Accu-Cheks.  Blood sugar stable less than 200 consistently. Recent Labs  Lab 05/24/20 1147 05/24/20 1725 05/24/20 2056 05/25/20 0727 05/25/20 1211  GLUCAP 145* 151* 198* 170* 164*   Hyperlipidemia -Continue statin  Hypomagnesemia -Improved with replacement.  Recheck tomorrow. 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  Dementia Anxiety/depression -Home meds include multiple mood altering medications including Xanax 1 mg at bedtime, buspirone 15 mg 3 times daily, olanzapine 5 mg at bedtime, Lyrica 25 mg daily, Lyrica 50 mg at bedtime, Zoloft 150 mg daily, trazodone 200 mg at bedtime. -Continue to monitor mental status.  Mobility: Long-term resident. Code Status:   Code Status: Full Code  Nutritional status: Body mass index is 22.37 kg/m.     Diet Order            Diet heart healthy/carb modified Room service appropriate? Yes; Fluid consistency: Thin  Diet effective now                 DVT prophylaxis: SCDs Start: 05/21/20 2101   Antimicrobials:  None Fluid: None Consultants: Oncology Family Communication:  None at bedside  Status is: Inpatient  Remains inpatient because of persistent low blood count.  Continues to need inpatient monitoring.  Dispo: The patient is from: SNF              Anticipated d/c is to: SNF              Patient currently is not medically stable to d/c.   Difficult to place patient No       Infusions:    Scheduled Meds: . ALPRAZolam  1 mg Oral QHS  . atorvastatin  40 mg Oral QHS  . B-complex with vitamin C  1 tablet Oral Daily  . busPIRone  15 mg Oral TID  . Chlorhexidine Gluconate Cloth  6 each Topical Daily  . fluticasone furoate-vilanterol  1 puff Inhalation Daily  . folic acid  1 mg Oral Daily  . insulin aspart  0-15 Units Subcutaneous TID AC & HS  . insulin detemir  12 Units Subcutaneous QHS  . lidocaine  1 patch Transdermal Q24H  . lisinopril  5 mg Oral Daily  . melatonin  3 mg Oral QHS  . metoprolol succinate  50 mg Oral Daily  . OLANZapine  5 mg Oral QHS  . pregabalin  25 mg Oral Daily  . pregabalin  50 mg Oral QHS  . sertraline  150 mg Oral Daily  . Tbo-filgastrim (GRANIX) SQ  480 mcg Subcutaneous q1800  . traZODone  200 mg Oral QHS  . umeclidinium bromide  1 puff  Inhalation Daily    Antimicrobials: Anti-infectives (From admission, onward)   None      PRN meds: acetaminophen **OR** [DISCONTINUED] acetaminophen, albuterol, ipratropium-albuterol, ondansetron **OR** ondansetron (ZOFRAN) IV, polyethylene glycol   Objective: Vitals:   05/25/20 0945 05/25/20 1152  BP: (!) 145/81   Pulse: 86   Resp:    Temp:    SpO2:  96%    Intake/Output Summary (Last 24 hours) at 05/25/2020 1258 Last data filed at 05/25/2020 0952 Gross per 24 hour  Intake 440 ml  Output --  Net 440 ml   Filed Weights   05/21/20 1636 05/22/20 0550  Weight: 78 kg 74.8 kg   Weight change:  Body mass index is 22.37 kg/m.   Physical Exam: General exam: Pleasant elderly Caucasian male.  Not in physical distress Skin: No rashes, lesions or ulcers. HEENT: Atraumatic, normocephalic, no obvious bleeding Lungs: Clear to auscultation bilaterally CVS:  Regular rate and rhythm, no murmur GI/Abd soft, nontender, nondistended, bowel sound present CNS: Sleeping, arousable on verbal command.  Knows he is in the hospital.  Baseline dementia Psychiatry: Mood appropriate Extremities: No pedal edema, no calf tenderness  Data Review: I have personally reviewed the laboratory data and studies available.  Recent Labs  Lab 05/21/20 1719 05/22/20 0822 05/23/20 0516 05/24/20 0508 05/25/20 0700  WBC 2.1* 1.5* 1.7* 2.8* 3.9*  NEUTROABS 0.7* 0.4* 0.8* 1.1* 1.0*  HGB 6.5* 8.4* 8.1* 8.4* 8.5*  HCT 19.7* 25.5* 24.3* 25.2* 25.9*  MCV 90.8 85.3 86.8 87.2 87.2  PLT 21* 13* 12* 17* 26*   Recent Labs  Lab 05/21/20 1719 05/22/20 0822 05/23/20 0516 05/24/20 0508 05/25/20 0700  NA 137 140 142 138 141  K 4.5 3.7 3.7 3.4* 3.5  CL 109 112* 113* 108 111  CO2 22 22 23 23 24   GLUCOSE 210* 136* 166* 135* 172*  BUN 17 12 12 16 11   CREATININE 0.84 0.64 0.77 0.88 0.73  CALCIUM 8.8* 8.4* 8.7* 8.8* 8.9  MG 1.4* 1.9 1.5*  --   --   PHOS 2.5  --  3.1  --   --     F/u labs  ordered Unresulted Labs (From admission, onward)          Start     Ordered   05/26/20 0500  CBC with Differential/Platelet  Daily,   R      05/25/20 1258   05/26/20 1791  Basic metabolic panel  Daily,   R      05/25/20 1258   05/26/20 0500  Magnesium  Tomorrow morning,   STAT        05/25/20 1258   05/26/20 0500  Phosphorus  Tomorrow morning,   R        05/25/20 1258   05/21/20 2025  Urinalysis, Complete w Microscopic  Once,   STAT        05/21/20 2024          Signed, Terrilee Croak, MD [facility] 05/25/2020

## 2020-05-25 NOTE — TOC Progression Note (Addendum)
Transition of Care [facility]) - Progression Note    Patient Details  Name: Andrew Ward MRN: [id number] Date of Birth: [birth year]  [facility] of [facility] CM/SW Contact  Joaquin Courts, RN Phone Number: 05/25/2020, 3:21 PM  Clinical Narrative:    CM reached out to [facility] place rep, patient is anticipated to be medically reach for dc back to [facility] place tomorrow 4/11.  VM left.  FL2 faxed to facility.  MD notified patient will need an updated covid test.   Expected Discharge Plan: Skilled Nursing Facility Barriers to Discharge: Continued Medical Work up  Expected Discharge Plan and Services Expected Discharge Plan: [place]   Discharge Planning Services: CM Consult   Living arrangements for the past 2 months: [place]                                       Social Determinants of Health (SDOH) Interventions    Readmission Risk Interventions Readmission Risk Prevention Plan 05/23/2020  Transportation Screening Complete  PCP or Specialist Appt within 3-5 Days Complete  HRI or [place] Complete  Social Work Consult for [place] Planning/Counseling Complete  Palliative Care Screening Not Applicable  Medication Review Press photographer) Complete  Some recent data might be hidden

## 2020-05-26 LAB — CBC WITH DIFFERENTIAL/PLATELET
Abs Immature Granulocytes: 0.41 10*3/uL — ABNORMAL HIGH (ref 0.00–0.07)
Basophils Absolute: 0.1 10*3/uL (ref 0.0–0.1)
Basophils Relative: 1 %
Eosinophils Absolute: 0.2 10*3/uL (ref 0.0–0.5)
Eosinophils Relative: 3 %
HCT: 27 % — ABNORMAL LOW (ref 39.0–52.0)
Hemoglobin: 8.8 g/dL — ABNORMAL LOW (ref 13.0–17.0)
Immature Granulocytes: 7 %
Lymphocytes Relative: 23 %
Lymphs Abs: 1.4 10*3/uL (ref 0.7–4.0)
MCH: 28.7 pg (ref 26.0–34.0)
MCHC: 32.6 g/dL (ref 30.0–36.0)
MCV: 87.9 fL (ref 80.0–100.0)
Monocytes Absolute: 1 10*3/uL (ref 0.1–1.0)
Monocytes Relative: 16 %
Neutro Abs: 3.1 10*3/uL (ref 1.7–7.7)
Neutrophils Relative %: 50 %
Platelets: 41 10*3/uL — ABNORMAL LOW (ref 150–400)
RBC: 3.07 MIL/uL — ABNORMAL LOW (ref 4.22–5.81)
RDW: 16.8 % — ABNORMAL HIGH (ref 11.5–15.5)
WBC: 6 10*3/uL (ref 4.0–10.5)
nRBC: 1.5 % — ABNORMAL HIGH (ref 0.0–0.2)

## 2020-05-26 LAB — BASIC METABOLIC PANEL
Anion gap: 8 (ref 5–15)
BUN: 10 mg/dL (ref 8–23)
CO2: 22 mmol/L (ref 22–32)
Calcium: 8.6 mg/dL — ABNORMAL LOW (ref 8.9–10.3)
Chloride: 109 mmol/L (ref 98–111)
Creatinine, Ser: 0.81 mg/dL (ref 0.61–1.24)
GFR, Estimated: >60 mL/min (ref >60)
Glucose, Bld: 152 mg/dL — ABNORMAL HIGH (ref 70–99)
Potassium: 3.2 mmol/L — ABNORMAL LOW (ref 3.5–5.1)
Sodium: 139 mmol/L (ref 135–145)

## 2020-05-26 LAB — PHOSPHORUS: Phosphorus: 3.3 mg/dL (ref 2.5–4.6)

## 2020-05-26 LAB — MAGNESIUM: Magnesium: 1.2 mg/dL — ABNORMAL LOW (ref 1.7–2.4)

## 2020-05-26 LAB — GLUCOSE, CAPILLARY
Glucose-Capillary: 142 mg/dL — ABNORMAL HIGH (ref 70–99)
Glucose-Capillary: 177 mg/dL — ABNORMAL HIGH (ref 70–99)

## 2020-05-26 LAB — SARS CORONAVIRUS 2 (TAT 6-24 HRS): SARS Coronavirus 2: NEGATIVE

## 2020-05-26 MED ORDER — POTASSIUM CHLORIDE CRYS ER 20 MEQ PO TBCR
40.0000 meq | EXTENDED_RELEASE_TABLET | Freq: Once | ORAL | Status: AC
  Administered 2020-05-26: 40 meq via ORAL
  Filled 2020-05-26: qty 2

## 2020-05-26 MED ORDER — MAGNESIUM SULFATE 2 GM/50ML IV SOLN
2.0000 g | Freq: Once | INTRAVENOUS | Status: AC
  Administered 2020-05-26: 2 g via INTRAVENOUS
  Filled 2020-05-26: qty 50

## 2020-05-26 NOTE — TOC Transition Note (Signed)
Transition of Care [facility]) - CM/SW Discharge Note   Patient Details  Name: Andrew Ward MRN: [id number] Date of Birth: 05-31-1945  Transition of Care [facility]) CM/SW Contact:  Lynnell Catalan, RN Phone Number: 05/26/2020, 12:38 PM   Clinical Narrative:    Pt to dc back to [facility] room 1104b today. [facility] called for transport. RN to call report to [phone].   Final next level of care: Skilled Nursing Facility Barriers to Discharge: Continued Medical Work up     Discharge Plan and Services   Discharge Planning Services: CM Consult                Readmission Risk Interventions Readmission Risk Prevention Plan 05/23/2020  Transportation Screening Complete  PCP or Specialist Appt within 3-5 Days Complete  HRI or Home Care Consult Complete  Social Work Consult for [place] Planning/Counseling Complete  Palliative Care Screening Not Applicable  Medication Review [employer]) Complete  Some recent data might be hidden

## 2020-05-26 NOTE — Progress Notes (Signed)
Report called to [facility], spoke with Lilly.

## 2020-05-26 NOTE — Progress Notes (Signed)
Brief Oncology Note:  CBC results from today noted below. He was started on Granix 480 mcg daily X5 days on 4/7.  Received 2 units PRBCs on 05/22/2020.  CBC    Component Value Date/Time   WBC 6.0 05/26/2020 0611   RBC 3.07 (L) 05/26/2020 0611   HGB 8.8 (L) 05/26/2020 0611   HCT 27.0 (L) 05/26/2020 0611   PLT 41 (L) 05/26/2020 0611   MCV 87.9 05/26/2020 0611   MCH 28.7 05/26/2020 0611   MCHC 32.6 05/26/2020 0611   RDW 16.8 (H) 05/26/2020 0611   LYMPHSABS 1.4 05/26/2020 0611   MONOABS 1.0 05/26/2020 0611   EOSABS 0.2 05/26/2020 0611   BASOSABS 0.1 05/26/2020 0611   Assessment #1 metastatic lung adenocarcinoma -managed at [facility] #2 chemotherapy related pancytopenia-last chemotherapy carboplatin/Alimta/Keytruda on 3/25.  Unclear if patient received G-CSF. #3 symptomatic anemia hemoglobin 6.5 status post 2 units of PRBCs hemoglobin now 8.8 (improved) #4 thrombocytopenia.  Patient is about 17 days post chemotherapy.  Platelets up to 41,000 today.  #5 severe neutropenia related to chemotherapy his [place] dropped to 400 - Up to 3100 today after starting Granix  Overall his pancytopenia is also worse due to the presence of liver cirrhosis which would be an additional risk factor as well as being on psychotropics which could add to his neutropenia.  Plan -Given severe neutropenia and his other associated medication and conditions that could worsen bone marrow suppression and his age of more than 89 and aggressive aggressive treatment  -ordered Granix daily until his [place] is more than 1000 -Transfuse PRBC for hemoglobin less than 7.5 or if symptomatic -Transfuse platelets prophylactically if platelets less than 10k or if bleeding. -Would recommend starting the patient on B complex p.o. daily to address any Alimta related bone marrow suppression and also giving vitamin B12 subcu daily x3. -Management of psychotropics including the Zyprexa and pregabalin per hospital  medicine. -Notified primary oncologist and he has outpatient follow-up already scheduled on 4/14.  -The patient can discharge from our standpoint if otherwise medically stable. He already has outpatient follow-up scheduled with his primary oncologist on 4/14.  Mikey Bussing, DNP, AGPCNP-BC, AOCNP

## 2020-05-26 NOTE — Care Management Important Message (Signed)
Important Message  Patient Details IM Letter given to the Patient. Name: Andrew Ward MRN: [id number] Date of Birth: Aug 01, 1945   Medicare Important Message Given:  Yes     Kerin Salen 05/26/2020, 10:55 AM

## 2020-05-26 NOTE — Discharge Summary (Signed)
Physician Discharge Summary  Andrew Ward JJK:[id number] DOB: [birth year] DOA: 05/21/2020  PCP: [name], DO  Admit date: 05/21/2020 Discharge date: 05/26/2020  Admitted From: [place]  Discharge disposition: Back to [facility]   Code Status: Full Code  Diet Recommendation: Cardiac/diabetic diet  Discharge Diagnosis:   Principal Problem:   Pancytopenia due to antineoplastic chemotherapy [facility]) Active Problems:   Dementia without behavioral disturbance ([place])   COPD (chronic obstructive pulmonary disease) ([place])   Essential hypertension   Adenocarcinoma of lung ([place])   Type 2 diabetes mellitus with hyperglycemia, with long-term current use of insulin ([place])   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 Hemoglobin    Hgb 6.5, platelets 24, wbc 1.9.    Brief narrative: Andrew Ward is a 75 y.o. male with PMH significant for dementia, hyperlipidemia, hypertension, hepatitis C (S/P Tx) with hepatitis C cirrhosis, COPD and adenocarcinoma of the right lung currently receiving chemotherapy under the care of oncology at [facility].  Patient is a long-term resident at [facility]. Patient was sent to the ED on 4/6 for pancytopenia. He was diagnosed with adenocarcinoma of the lung in 2016, initially treated with lobectomy but in the past few months, patient developed recurrent metastatic disease for which he is receiving chemotherapy at [facility].  Last chemo was on 05/09/2020. Routine blood work at the SNF on 4/6 showed pancytopenia with hemoglobin low at 6.5, platelet count low at 24 and white count low at 1.9.  Patient was hence sent to the ED for further evaluation management  ED physician discussed the case with oncologist Dr. Morene Rankins who recommended that patient can stay at [facility] long for inpatient care and does  not need to be transferred to [facility]. As recommended, patient was given 2 units of PRBC transfusion Admitted to hospitalist service for further evaluation and management.  Subjective: Patient was seen and examined this morning. Lying on bed.  Not in distress.  Calm.  No bleeding.  Hospital course: Pancytopenia due to chemotherapy -Regular care with oncologist at [facility]. -Dr. Morene Rankins was called by ED physician and as recommended, patient was given 2 units of PRBCs. -He was given Granix daily, WBC count improved.  Platelet count improved to 27,000 today.  Hemoglobin improving, 8.8 today. -Okay to discharge back to [facility] today. Recent Labs  Lab 05/22/20 0822 05/23/20 0516 05/24/20 0508 05/25/20 0700 05/26/20 0611  WBC 1.5* 1.7* 2.8* 3.9* 6.0  NEUTROABS 0.4* 0.8* 1.1* 1.0* 3.1  HGB 8.4* 8.1* 8.4* 8.5* 8.8*  HCT 25.5* 24.3* 25.2* 25.9* 27.0*  MCV 85.3 86.8 87.2 87.2 87.9  PLT 13* 12* 17* 26* 41*   Adenocarcinoma of lung -Initially diagnosed in 2016 and managed with right upper lobe lobectomy -Unfortunately the past several months patient is developed bilateral pulmonary nodules consistent with recurrent metastatic disease -Patient currently receiving current chemotherapy regimen including Keytruda, carboplatinum and Altima since 03/21/2020 with [facility] health oncology. -Close outpatient follow-up with patient's oncologist for discussion about adjustments to chemotherapy regimen at time of discharge.  Dementia without behavioral disturbance -Currently remains alert, awake, oriented to place and person.  COPD -Not in exacerbation. -Continue home regimen of inhalers, bronchodilators: Advair Diskus, albuterol, Spiriva  Essential hypertension -Blood pressure controlled on Toprol 50 mg daily, lisinopril 5 mg daily  Type 2 diabetes mellitus with hyperglycemia -A1c 8.1  on 4/6 -Home meds include Levemir 12 units at bedtime, sliding scale insulin and  Metformin 1000 mg twice daily. -Continue the same post discharge Recent Labs  Lab 05/25/20 0727 05/25/20 1211 05/25/20 1748 05/25/20 2106 05/26/20 0730  GLUCAP 170* 164* 110* 213* 142*   Hyperlipidemia -Continue statin  Hypokalemia/hypomagnesemia -Because of poor oral intake, potassium enzyme level were low.  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  Dementia Anxiety/depression -Home meds include multiple mood altering medications including Xanax 1 mg at bedtime, buspirone 15 mg 3 times daily, olanzapine 5 mg at bedtime, Lyrica 25 mg daily, Lyrica 50 mg at bedtime, Zoloft 150 mg daily, trazodone 200 mg at bedtime. -Continue to monitor mental status.  Stable to discharge back to [facility] today   Wound care:    Discharge Exam:   Vitals:   05/25/20 1524 05/25/20 2104 05/26/20 0618 05/26/20 0843  BP: (!) 127/56 136/88 120/72   Pulse: 74 92 84   Resp:  18 16   Temp: 97.8 F (36.6 C) 98.3 F (36.8 C) 98.7 F (37.1 C)   TempSrc: Oral Oral Oral   SpO2: 96% 93% 90% 97%  Weight:      Height:        Body mass index is 22.37 kg/m.  General exam: Pleasant, elderly Caucasian male.  Not in distress Skin: No rashes, lesions or ulcers. HEENT: Atraumatic, normocephalic, no obvious bleeding Lungs: Clear to auscultation bilaterally CVS: Regular rate and rhythm, no murmur GI/Abd soft, nontender, nondistended, bowel sound present CNS: Alert, awake, oriented to place. Psychiatry: Calm.  Demented at baseline Extremities: No pedal edema, no calf tenderness  Follow ups:   Discharge Instructions    Increase activity slowly   Complete by: As directed        Follow-up Information    [name], DO Follow up.   Specialty: Family Medicine Contact information: [place] [address] [phone]               Recommendations for Outpatient Follow-Up:   1. Follow-up with PCP as an outpatient 2. Follow-up with regular oncologist as an outpatient  Discharge Instructions:  Follow with Primary MD [name], DO in 7 days   Get CBC/BMP checked in next visit within 1 week by PCP or SNF MD ( we routinely change or add medications that can affect your baseline labs and fluid status, therefore we recommend that you get the mentioned basic workup next visit with your PCP, your PCP may decide not to get them or add new tests based on their clinical decision)  On your next visit with your PCP, please Get Medicines reviewed and adjusted.  Please request your PCP  to go over all Hospital Tests and Procedure/Radiological results at the follow up, please get all Hospital records sent to your Prim MD by signing hospital release before you go home.  Activity: As tolerated with Full fall precautions use walker/cane & assistance as needed  For Heart failure patients - Check your Weight same time everyday, if you gain over 2 pounds, or you develop  in leg swelling, experience more shortness of breath or chest pain, call your Primary MD immediately. Follow Cardiac Low Salt Diet and 1.5 lit/day fluid restriction.  If you have smoked or chewed Tobacco in the last 2 yrs please stop smoking, stop any regular Alcohol  and or any Recreational drug use.  If you experience worsening of your admission symptoms, develop shortness of breath, life threatening emergency, suicidal or homicidal thoughts you must seek medical attention immediately by calling 911 or calling your MD immediately  if symptoms less severe.  You Must read complete instructions/literature along with all the possible adverse reactions/side effects for all the  Medicines you take and that have been prescribed to you. Take any new Medicines after you have completely understood and accpet all the possible adverse reactions/side effects.   Do not drive, operate heavy machinery, perform activities at heights, swimming or participation in water activities or provide baby sitting services if your were admitted for syncope or siezures until you have seen by Primary MD or a Neurologist and advised to do so again.  Do not drive when taking Pain medications.  Do not take more than prescribed Pain, Sleep and Anxiety Medications  Wear Seat belts while driving.   Please note You were cared for by a hospitalist during your hospital stay. If you have any questions about your discharge medications or the care you received while you were in the hospital after you are discharged, you can call the unit and asked to speak with the hospitalist on call if the hospitalist that took care of you is not available. Once you are discharged, your primary care physician will handle any further medical issues. Please note that NO REFILLS for any discharge medications will be authorized once you are discharged, as it is imperative that you return to your primary care physician (or establish a relationship with a primary care physician if you do not have one) for your aftercare needs so that they can reassess your need for medications and monitor your lab values.    Allergies as of 05/26/2020      Reactions   Fentanyl    Other reaction(s): Other (See Comments) Pt stated it made him feel "weird" Pt stated it made him feel "weird"   Atorvastatin    Other reaction(s): Myalgias (intolerance), Other (See Comments) Leg pain Leg pain   Duloxetine    Other reaction(s): Other (See Comments) "not sure what happened" maybe chest got tight "not sure what happened" maybe chest got tight Unspecified reaction      Medication List    TAKE these medications   acetaminophen 325 MG  tablet Commonly known as: TYLENOL Take 650 mg by mouth every 6 (six) hours as needed for moderate pain.   acetaminophen 500 MG tablet Commonly known as: TYLENOL Take 1,000 mg by mouth in the morning and at bedtime.   Advair Diskus 250-50 MCG/DOSE Aepb Generic drug: Fluticasone-Salmeterol Inhale 1 puff into the lungs 2 (two) times daily.   albuterol 108 (90 Base) MCG/ACT inhaler Commonly known as: VENTOLIN HFA Inhale 2 puffs into the lungs daily in the afternoon.   ALPRAZolam 1 MG tablet Commonly known as: XANAX Take 1 mg by mouth at bedtime.   antiseptic oral rinse Liqd 10 mLs by Mouth Rinse route in the morning and at bedtime.   atorvastatin 40 MG tablet Commonly known as: LIPITOR Take 40 mg by mouth at bedtime.   B-complex with vitamin C tablet Take 1 tablet by mouth daily.  bisacodyl 5 MG EC tablet Commonly known as: DULCOLAX Take 10 mg by mouth daily as needed for moderate constipation.   busPIRone 15 MG tablet Commonly known as: BUSPAR Take 15 mg by mouth 3 (three) times daily.   folic acid 1 MG tablet Commonly known as: FOLVITE Take 1 mg by mouth daily.   insulin aspart 100 UNIT/ML injection Commonly known as: novoLOG Inject 0-14 Units into the skin as directed. If BS is 70-200=0 units 201-250=2 units 251-300=4 units 301-350=6 units 351-400=8 units 401-450=10 units 451-500=12 units BS>500 or "HIGH"=14 units; Re-check BS in 2 Hours. If BS>400 or <80, Call PEC Triage   ipratropium-albuterol 0.5-2.5 (3) MG/3ML Soln Commonly known as: DUONEB Inhale 3 mLs into the lungs every 6 (six) hours as needed (sob/wheezing).   Levemir 100 UNIT/ML injection Generic drug: insulin detemir Inject 12 Units into the skin at bedtime.   Lidocaine 4 % Ptch Apply 1 patch topically in the morning and at bedtime.   lisinopril 5 MG tablet Commonly known as: ZESTRIL Take 5 mg by mouth daily.   melatonin 3 MG Tabs tablet Take 3 mg by mouth at bedtime.   metFORMIN 1000  MG tablet Commonly known as: GLUCOPHAGE Take 1,000 mg by mouth 2 (two) times daily.   metoprolol succinate 50 MG 24 hr tablet Commonly known as: TOPROL-XL Take 50 mg by mouth daily.   OLANZapine 5 MG tablet Commonly known as: ZYPREXA Take 5 mg by mouth at bedtime.   ondansetron 4 MG disintegrating tablet Commonly known as: ZOFRAN-ODT Take 4 mg by mouth every 8 (eight) hours as needed for nausea or vomiting.   pregabalin 25 MG capsule Commonly known as: LYRICA Take 25 mg by mouth daily.   pregabalin 50 MG capsule Commonly known as: LYRICA Take 50 mg by mouth at bedtime.   sertraline 50 MG tablet Commonly known as: ZOLOFT Take 150 mg by mouth daily.   tiotropium 18 MCG inhalation capsule Commonly known as: SPIRIVA Place 18 mcg into inhaler and inhale daily.   traZODone 100 MG tablet Commonly known as: DESYREL Take 200 mg by mouth at bedtime.   Vitamin D (Ergocalciferol) 1.25 MG (50000 UNIT) Caps capsule Commonly known as: DRISDOL Take 50,000 Units by mouth every 30 (thirty) days.       Time coordinating discharge: 35 minutes  The results of significant diagnostics from this hospitalization (including imaging, microbiology, ancillary and laboratory) are listed below for reference.    Procedures and Diagnostic Studies:   DG Chest 1 View  Result Date: 05/21/2020 CLINICAL DATA:  Shortness of breath. Low white cell count. History of lung cancer. EXAM: CHEST  1 VIEW COMPARISON:  04/11/2020 FINDINGS: Power port type central venous catheter with tip over the cavoatrial junction region. Heart size and pulmonary vascularity are normal. Shallow inspiration. Nodular and interstitial changes seen in the lungs on prior study are less prominent than previously. No developing consolidation. No pleural effusions. No pneumothorax. IMPRESSION: Shallow inspiration. No evidence of active pulmonary disease. Nodular infiltration seen previously has improved. Electronically Signed   By:  Lucienne Capers M.D.   On: 05/21/2020 21:00     Labs:   Basic Metabolic Panel: Recent Labs  Lab 05/21/20 1719 05/22/20 0822 05/23/20 0516 05/24/20 0508 05/25/20 0700 05/26/20 0611  NA 137 140 142 138 141 139  K 4.5 3.7 3.7 3.4* 3.5 3.2*  CL 109 112* 113* 108 111 109  CO2 22 22 23 23 24 22   GLUCOSE 210* 136* 166* 135* 172* 152*  BUN 17 12 12 16 11 10   CREATININE 0.84 0.64 0.77 0.88 0.73 0.81  CALCIUM 8.8* 8.4* 8.7* 8.8* 8.9 8.6*  MG 1.4* 1.9 1.5*  --   --  1.2*  PHOS 2.5  --  3.1  --   --  3.3   GFR Estimated Creatinine Clearance: 85.9 mL/min (by C-G formula based on SCr of 0.81 mg/dL). Liver Function Tests: Recent Labs  Lab 05/21/20 1719 05/22/20 0822  AST 28 26  ALT 31 28  ALKPHOS 59 55  BILITOT 0.7 1.3*  PROT 6.6 6.1*  ALBUMIN 3.6 3.4*   No results for input(s): LIPASE, AMYLASE in the last 168 hours. No results for input(s): AMMONIA in the last 168 hours. Coagulation profile Recent Labs  Lab 05/22/20 0822  INR 1.1    CBC: Recent Labs  Lab 05/22/20 0822 05/23/20 0516 05/24/20 0508 05/25/20 0700 05/26/20 0611  WBC 1.5* 1.7* 2.8* 3.9* 6.0  NEUTROABS 0.4* 0.8* 1.1* 1.0* 3.1  HGB 8.4* 8.1* 8.4* 8.5* 8.8*  HCT 25.5* 24.3* 25.2* 25.9* 27.0*  MCV 85.3 86.8 87.2 87.2 87.9  PLT 13* 12* 17* 26* 41*   Cardiac Enzymes: No results for input(s): CKTOTAL, CKMB, CKMBINDEX, TROPONINI in the last 168 hours. BNP: Invalid input(s): POCBNP CBG: Recent Labs  Lab 05/25/20 0727 05/25/20 1211 05/25/20 1748 05/25/20 2106 05/26/20 0730  GLUCAP 170* 164* 110* 213* 142*   D-Dimer No results for input(s): DDIMER in the last 72 hours. Hgb A1c No results for input(s): HGBA1C in the last 72 hours. Lipid Profile No results for input(s): CHOL, HDL, LDLCALC, TRIG, CHOLHDL, LDLDIRECT in the last 72 hours. Thyroid function studies No results for input(s): TSH, T4TOTAL, T3FREE, THYROIDAB in the last 72 hours.  Invalid input(s): FREET3 Anemia work up No results for  input(s): VITAMINB12, FOLATE, FERRITIN, TIBC, IRON, RETICCTPCT in the last 72 hours. Microbiology Recent Results (from the past 240 hour(s))  Resp Panel by RT-PCR (Flu A&B, Covid) Nasopharyngeal Swab     Status: None   Collection Time: 05/21/20  8:25 PM   Specimen: Nasopharyngeal Swab; Nasopharyngeal(NP) swabs in vial transport medium  Result Value Ref Range Status   SARS Coronavirus 2 by RT PCR NEGATIVE NEGATIVE Final    Comment: (NOTE) SARS-CoV-2 target nucleic acids are NOT DETECTED.  The SARS-CoV-2 RNA is generally detectable in upper respiratory specimens during the acute phase of infection. The lowest concentration of SARS-CoV-2 viral copies this assay can detect is 138 copies/mL. A negative result does not preclude SARS-Cov-2 infection and should not be used as the sole basis for treatment or other patient management decisions. A negative result may occur with  improper specimen collection/handling, submission of specimen other than nasopharyngeal swab, presence of viral mutation(s) within the areas targeted by this assay, and inadequate number of viral copies(<138 copies/mL). A negative result must be combined with clinical observations, patient history, and epidemiological information. The expected result is Negative.  Fact Sheet for Patients:  EntrepreneurPulse.com.au  Fact Sheet for Healthcare Providers:  IncredibleEmployment.be  This test is no t yet approved or cleared by the [place] FDA and  has been authorized for detection and/or diagnosis of SARS-CoV-2 by FDA under an Emergency Use Authorization (EUA). This EUA will remain  in effect (meaning this test can be used) for the duration of the COVID-19 declaration under Section 564(b)(1) of the Act, 21 U.S.C.section 360bbb-3(b)(1), unless the authorization is terminated  or revoked sooner.       Influenza A by PCR NEGATIVE NEGATIVE Final  Influenza B by PCR NEGATIVE  NEGATIVE Final    Comment: (NOTE) The Xpert Xpress SARS-CoV-2/FLU/RSV plus assay is intended as an aid in the diagnosis of influenza from Nasopharyngeal swab specimens and should not be used as a sole basis for treatment. Nasal washings and aspirates are unacceptable for Xpert Xpress SARS-CoV-2/FLU/RSV testing.  Fact Sheet for Patients: EntrepreneurPulse.com.au  Fact Sheet for Healthcare Providers: IncredibleEmployment.be  This test is not yet approved or cleared by the [place] FDA and has been authorized for detection and/or diagnosis of SARS-CoV-2 by FDA under an Emergency Use Authorization (EUA). This EUA will remain in effect (meaning this test can be used) for the duration of the COVID-19 declaration under Section 564(b)(1) of the Act, 21 U.S.C. section 360bbb-3(b)(1), unless the authorization is terminated or revoked.  Performed at [facility], [place] [address], [place], [place] [address]   MRSA PCR Screening     Status: None   Collection Time: 05/21/20 11:30 PM   Specimen: Nasopharyngeal  Result Value Ref Range Status   MRSA by PCR NEGATIVE NEGATIVE Final    Comment:        The GeneXpert MRSA Assay (FDA approved for NASAL specimens only), is one component of a comprehensive MRSA colonization surveillance program. It is not intended to diagnose MRSA infection nor to guide or monitor treatment for MRSA infections. Performed at [facility], [place] [address] [place], [place] [address]      Signed: Marlowe Aschoff Calen Posch  [facility] 05/26/2020, 10:28 AM

## 2021-11-21 ENCOUNTER — Other Ambulatory Visit: Payer: Self-pay

## 2021-11-21 ENCOUNTER — Emergency Department (HOSPITAL_COMMUNITY)
Admission: EM | Admit: 2021-11-21 | Discharge: 2021-11-22 | Disposition: A | Discharge status: Home / Self Care | Payer: Medicare (Managed Care) | Attending: Emergency Medicine | Admitting: Emergency Medicine

## 2021-11-21 ENCOUNTER — Encounter (HOSPITAL_COMMUNITY): Payer: Self-pay

## 2021-11-21 ENCOUNTER — Emergency Department (HOSPITAL_COMMUNITY): Payer: Medicare (Managed Care)

## 2021-11-21 DIAGNOSIS — F0390 Unspecified dementia without behavioral disturbance: Secondary | ICD-10-CM | POA: Diagnosis not present

## 2021-11-21 DIAGNOSIS — J181 Lobar pneumonia, unspecified organism: Secondary | ICD-10-CM | POA: Diagnosis not present

## 2021-11-21 DIAGNOSIS — E119 Type 2 diabetes mellitus without complications: Secondary | ICD-10-CM | POA: Insufficient documentation

## 2021-11-21 DIAGNOSIS — J449 Chronic obstructive pulmonary disease, unspecified: Secondary | ICD-10-CM | POA: Insufficient documentation

## 2021-11-21 DIAGNOSIS — I10 Essential (primary) hypertension: Secondary | ICD-10-CM | POA: Diagnosis not present

## 2021-11-21 DIAGNOSIS — J189 Pneumonia, unspecified organism: Secondary | ICD-10-CM

## 2021-11-21 DIAGNOSIS — Z20822 Contact with and (suspected) exposure to covid-19: Secondary | ICD-10-CM | POA: Insufficient documentation

## 2021-11-21 DIAGNOSIS — R0602 Shortness of breath: Secondary | ICD-10-CM | POA: Diagnosis present

## 2021-11-21 LAB — HEPATIC FUNCTION PANEL
ALT: 27 U/L (ref 0–44)
AST: 21 U/L (ref 15–41)
Albumin: 4 g/dL (ref 3.5–5.0)
Alkaline Phosphatase: 73 U/L (ref 38–126)
Bilirubin, Direct: 0.1 mg/dL (ref 0.0–0.2)
Indirect Bilirubin: 0.5 mg/dL (ref 0.3–0.9)
Total Bilirubin: 0.6 mg/dL (ref 0.3–1.2)
Total Protein: 7.1 g/dL (ref 6.5–8.1)

## 2021-11-21 LAB — CBC WITH DIFFERENTIAL/PLATELET
Abs Immature Granulocytes: 0.03 10*3/uL (ref 0.00–0.07)
Basophils Absolute: 0.1 10*3/uL (ref 0.0–0.1)
Basophils Relative: 1 %
Eosinophils Absolute: 0.8 10*3/uL — ABNORMAL HIGH (ref 0.0–0.5)
Eosinophils Relative: 10 %
HCT: 38.9 % — ABNORMAL LOW (ref 39.0–52.0)
Hemoglobin: 12.7 g/dL — ABNORMAL LOW (ref 13.0–17.0)
Immature Granulocytes: 0 %
Lymphocytes Relative: 22 %
Lymphs Abs: 1.8 10*3/uL (ref 0.7–4.0)
MCH: 29.6 pg (ref 26.0–34.0)
MCHC: 32.6 g/dL (ref 30.0–36.0)
MCV: 90.7 fL (ref 80.0–100.0)
Monocytes Absolute: 0.5 10*3/uL (ref 0.1–1.0)
Monocytes Relative: 6 %
Neutro Abs: 5 10*3/uL (ref 1.7–7.7)
Neutrophils Relative %: 61 %
Platelets: 137 10*3/uL — ABNORMAL LOW (ref 150–400)
RBC: 4.29 MIL/uL (ref 4.22–5.81)
RDW: 14 % (ref 11.5–15.5)
WBC: 8.2 10*3/uL (ref 4.0–10.5)
nRBC: 0 % (ref 0.0–0.2)

## 2021-11-21 LAB — BASIC METABOLIC PANEL
Anion gap: 9 (ref 5–15)
BUN: 14 mg/dL (ref 8–23)
CO2: 23 mmol/L (ref 22–32)
Calcium: 9.1 mg/dL (ref 8.9–10.3)
Chloride: 105 mmol/L (ref 98–111)
Creatinine, Ser: 0.8 mg/dL (ref 0.61–1.24)
GFR, Estimated: >60 mL/min (ref >60)
Glucose, Bld: 194 mg/dL — ABNORMAL HIGH (ref 70–99)
Potassium: 4.1 mmol/L (ref 3.5–5.1)
Sodium: 137 mmol/L (ref 135–145)

## 2021-11-21 LAB — BRAIN NATRIURETIC PEPTIDE: B Natriuretic Peptide: 19.1 pg/mL (ref 0.0–100.0)

## 2021-11-21 LAB — PROTIME-INR
INR: 1.1 (ref 0.8–1.2)
Prothrombin Time: 14.3 seconds (ref 11.4–15.2)

## 2021-11-21 LAB — TROPONIN I (HIGH SENSITIVITY)
Troponin I (High Sensitivity): 3 ng/L (ref <18)
Troponin I (High Sensitivity): 3 ng/L (ref <18)

## 2021-11-21 LAB — RESP PANEL BY RT-PCR (FLU A&B, COVID) ARPGX2
Influenza A by PCR: NEGATIVE
Influenza B by PCR: NEGATIVE
SARS Coronavirus 2 by RT PCR: NEGATIVE

## 2021-11-21 LAB — LIPASE, BLOOD: Lipase: 110 U/L — ABNORMAL HIGH (ref 11–51)

## 2021-11-21 LAB — D-DIMER, QUANTITATIVE: D-Dimer, Quant: 1.45 ug/mL-FEU — ABNORMAL HIGH (ref 0.00–0.50)

## 2021-11-21 MED ORDER — IOHEXOL 350 MG/ML SOLN
75.0000 mL | Freq: Once | INTRAVENOUS | Status: AC | PRN
  Administered 2021-11-21: 75 mL via INTRAVENOUS

## 2021-11-21 NOTE — ED Triage Notes (Signed)
Per EMS- patient is from [place]. Patient is alert and oriented. Patient requested to be seen in the ED. Patient c/o SOB x 1 days. Patient denies fever, N/v/D.

## 2021-11-21 NOTE — ED Notes (Signed)
Back From CT 

## 2021-11-21 NOTE — ED Provider Triage Note (Signed)
Emergency Medicine Provider Triage Evaluation Note  Andrew Ward , a 76 y.o. male  was evaluated in triage.  Pt complains of shortness of breath.  History of COPD, diabetes, hypertension, cirrhosis with prior EtOH use, lung cancer states has been more short of breath than normal over the last few days.  He has a cough which is nonproductive.  No pain or swelling to his lower extremities however does state he has chronic numbness to his feet.  He appears pale in room however denies any melena or bright blood per rectum.  No emesis.  Follows with [facility] for his lung cancer no sick contacts.  Review of Systems  Positive: Cough, sob Negative: Fever, emesis  Physical Exam  BP 122/74 (BP Location: Right Arm)   Pulse 88   Temp 97.9 F (36.6 C) (Oral)   Resp 16   Ht 6' (1.829 m)   Wt 79.4 kg   SpO2 91%   BMI 23.73 kg/m  Gen:   Awake, no distress   Resp:  Normal effort  MSK:   Moves extremities without difficulty  Other:    Medical Decision Making  Medically screening exam initiated at 4:20 PM.  Appropriate orders placed.  Andrew Ward was informed that the remainder of the evaluation will be completed by another provider, this initial triage assessment does not replace that evaluation, and the importance of remaining in the ED until their evaluation is complete.  SOB   Andrew Ward A, PA-C 11/21/21 1622

## 2021-11-21 NOTE — ED Notes (Signed)
To CT

## 2021-11-21 NOTE — ED Provider Notes (Signed)
Emergency Department Provider Note   I have reviewed the triage vital signs and the nursing notes.   HISTORY  Chief Complaint Shortness of Breath   HPI Andrew Ward is a 76 y.o. male with past medical history of COPD, diabetes, hypertension and presents to the emergency department from [place] and rehab describing shortness of breath and right-sided chest discomfort.  Patient describes a right-sided chest "pressure" with no clear modifying factors. No fever or chills.  No vomiting or diarrhea symptoms.  Denies abdominal discomfort or back pain.  He states he has not been using his albuterol inhaler as he did not feel he needed it.  He has had some mild nasal congestion but no other fever or cough.    Past Medical History:  Diagnosis Date   Anxiety    COPD (chronic obstructive pulmonary disease) (HCC)    Dementia ([place])    Depression    Diabetes mellitus without complication ([place])    Hypertension     Review of Systems  Constitutional: No fever/chills Eyes: No visual changes. ENT: No sore throat. Cardiovascular: Positive chest pain. Respiratory: Positive shortness of breath. Gastrointestinal: No abdominal pain.  No nausea, no vomiting.  No diarrhea.  No constipation. Genitourinary: Negative for dysuria. Musculoskeletal: Negative for back pain. Skin: Negative for rash. Neurological: Negative for headaches, focal weakness or numbness.  ____________________________________________   PHYSICAL EXAM:  VITAL SIGNS: ED Triage Vitals  Enc Vitals Group     BP 11/21/21 1613 122/74     Pulse Rate 11/21/21 1613 88     Resp 11/21/21 1613 16     Temp 11/21/21 1613 97.9 F (36.6 C)     Temp Source 11/21/21 1613 Oral     SpO2 11/21/21 1612 98 %     Weight 11/21/21 1618 175 lb (79.4 kg)     Height 11/21/21 1618 6' (1.829 m)   Constitutional: Alert and oriented. Well appearing and in no acute distress. Eyes: Conjunctivae are normal.  Head: Atraumatic. Nose: No  congestion/rhinnorhea. Mouth/Throat: Mucous membranes are moist.  Oropharynx non-erythematous. Neck: No stridor.   Cardiovascular: Normal rate, regular rhythm. Good peripheral circulation. Grossly normal heart sounds.   Respiratory: Normal respiratory effort.  No retractions. Lungs without wheezing.  Mild anterior rhonchi bilaterally.  Gastrointestinal: Soft and nontender. No distention.  Musculoskeletal: No lower extremity tenderness nor edema. No gross deformities of extremities. Neurologic:  Normal speech and language. No gross focal neurologic deficits are appreciated.  Skin:  Skin is warm, dry and intact. No rash noted.  ____________________________________________   LABS (all labs ordered are listed, but only abnormal results are displayed)  Labs Reviewed  RESP PANEL BY RT-PCR (FLU A&B, COVID) [id number]  CBC WITH DIFFERENTIAL/PLATELET  BASIC METABOLIC PANEL  BRAIN NATRIURETIC PEPTIDE  PROTIME-INR  HEPATIC FUNCTION PANEL  LIPASE, BLOOD  D-DIMER, QUANTITATIVE  TROPONIN I (HIGH SENSITIVITY)  TROPONIN I (HIGH SENSITIVITY)   ____________________________________________  EKG   EKG Interpretation  Date/Time:  Saturday November 21 2021 17:35:02 EDT Ventricular Rate:  86 PR Interval:  142 QRS Duration: 83 QT Interval:  366 QTC Calculation: 438 R Axis:   53 Text Interpretation: Sinus rhythm Confirmed by Nanda Quinton [phone]) on 11/21/2021 5:38:34 PM        ____________________________________________  RADIOLOGY  DG Chest 2 View  Result Date: 11/21/2021 CLINICAL DATA:  Shortness of breath.  History of lung cancer. EXAM: CHEST - 2 VIEW COMPARISON:  Radiographs 05/21/2020 and 04/11/2020.  CT 01/16/2018. FINDINGS: Right IJ Port-A-Cath extends to  the level of the upper right atrium, unchanged. The heart size and mediastinal contours are stable. The lungs appear clear. There is no pleural effusion or pneumothorax. No acute osseous findings are evident. Mild degenerative changes  are present in the spine. IMPRESSION: Stable chest. No acute cardiopulmonary process. Right IJ Port-A-Cath unchanged in position. Electronically Signed   By: Richardean Sale M.D.   On: 11/21/2021 17:03    ____________________________________________   PROCEDURES  Procedure(s) performed:   Procedures   ____________________________________________   INITIAL IMPRESSION / ASSESSMENT AND PLAN / ED COURSE  Pertinent labs & imaging results that were available during my care of the patient were reviewed by me and considered in my medical decision making (see chart for details).   This patient is Presenting for Evaluation of CP, which does require a range of treatment options, and is a complaint that involves a high risk of morbidity and mortality.  The Differential Diagnoses includes but is not exclusive to acute coronary syndrome, aortic dissection, pulmonary embolism, cardiac tamponade, community-acquired pneumonia, pericarditis, musculoskeletal chest wall pain, etc.   Critical Interventions-    Medications - No data to display  Reassessment after intervention:     I decided to review pertinent External Data, and in summary patient with MOST form at bedside describing Full Code status.    Clinical Laboratory Tests Ordered, included ***  Radiologic Tests Ordered, included CXR. I independently interpreted the images and agree with radiology interpretation.   Cardiac Monitor Tracing which shows NSR.    Social Determinants of Health Risk patient is not an active smoker.  Consult complete with  Medical Decision Making: Summary:  Patient presents emergency department for evaluation of right-sided chest pain and shortness of breath.  Mild rhonchi on exam without hypoxemia, increased work of breathing, fever, hypotension.  Plan for screening blood work including COVID swab, troponin, D-dimer, BNP and reassess.  Reevaluation with update and discussion with   ***Considered  admission***  Disposition:   ____________________________________________  FINAL CLINICAL IMPRESSION(S) / ED DIAGNOSES  Final diagnoses:  None     NEW OUTPATIENT MEDICATIONS STARTED DURING THIS VISIT:  New Prescriptions   No medications on file    Note:  This document was prepared using Dragon voice recognition software and may include unintentional dictation errors.  Nanda Quinton, MD, [facility] Emergency Medicine

## 2021-11-22 MED ORDER — CEFDINIR 300 MG PO CAPS
300.0000 mg | ORAL_CAPSULE | Freq: Once | ORAL | Status: AC
  Administered 2021-11-22: 300 mg via ORAL
  Filled 2021-11-22: qty 1

## 2021-11-22 MED ORDER — AZITHROMYCIN 250 MG PO TABS
500.0000 mg | ORAL_TABLET | Freq: Once | ORAL | Status: AC
  Administered 2021-11-22: 500 mg via ORAL
  Filled 2021-11-22: qty 2

## 2021-11-22 MED ORDER — CEFDINIR 300 MG PO CAPS: 300.0000 mg | ORAL_CAPSULE | Freq: Two times a day (BID) | ORAL | 0 refills | Status: DC

## 2021-11-22 MED ORDER — AZITHROMYCIN 250 MG PO TABS: 250.0000 mg | ORAL_TABLET | Freq: Every day | ORAL | 0 refills | Status: DC

## 2021-11-22 NOTE — ED Notes (Signed)
PTAR Requested

## 2021-11-22 NOTE — ED Provider Notes (Signed)
Care assumed at 2300.  Patient with history of diabetes, lung cancer here for evaluation of shortness of breath.  Care assumed pending CTA.  CTA is concerning for pneumonia.  Discussed with patient findings of studies.  Discussed with patient stepdaughter over the phone regarding findings of studies and need to continue antibiotic therapy as well as importance of outpatient follow-up for further evaluation.  Discussed with patient importance of following up with his oncologist and PCP for reevaluation.  Return precautions discussed.   Quintella Reichert, MD 11/22/21 [phone]

## 2021-11-22 NOTE — Discharge Instructions (Addendum)
You had a CT performed in the emergency department today.  The CT scan shows pneumonia, but cancer can also look like this.  It is very important for your oncologist to know about the CT scan.  Please follow-up closely with your oncologist and family doctor.  Get rechecked if you have breathing problems or new concerning symptoms.

## 2023-01-25 ENCOUNTER — Other Ambulatory Visit: Payer: Self-pay

## 2023-01-25 ENCOUNTER — Other Ambulatory Visit (HOSPITAL_COMMUNITY): Payer: Medicare Other

## 2023-01-25 ENCOUNTER — Emergency Department (HOSPITAL_COMMUNITY): Payer: Medicare Other

## 2023-01-25 ENCOUNTER — Inpatient Hospital Stay (HOSPITAL_COMMUNITY)
Admission: EM | Admit: 2023-01-25 | Discharge: 2023-01-28 | DRG: 158 | Disposition: A | Discharge status: Skilled Nursing Facility | Payer: Medicare Other | Attending: Family Medicine | Admitting: Family Medicine

## 2023-01-25 DIAGNOSIS — E876 Hypokalemia: Secondary | ICD-10-CM | POA: Diagnosis present

## 2023-01-25 DIAGNOSIS — J449 Chronic obstructive pulmonary disease, unspecified: Secondary | ICD-10-CM | POA: Diagnosis present

## 2023-01-25 DIAGNOSIS — K122 Cellulitis and abscess of mouth: Secondary | ICD-10-CM | POA: Diagnosis present

## 2023-01-25 DIAGNOSIS — Z5329 Procedure and treatment not carried out because of patient's decision for other reasons: Secondary | ICD-10-CM | POA: Diagnosis present

## 2023-01-25 DIAGNOSIS — Z888 Allergy status to other drugs, medicaments and biological substances status: Secondary | ICD-10-CM

## 2023-01-25 DIAGNOSIS — K130 Diseases of lips: Principal | ICD-10-CM | POA: Diagnosis present

## 2023-01-25 DIAGNOSIS — F32A Depression, unspecified: Secondary | ICD-10-CM | POA: Diagnosis present

## 2023-01-25 DIAGNOSIS — F0390 Unspecified dementia without behavioral disturbance: Secondary | ICD-10-CM | POA: Diagnosis not present

## 2023-01-25 DIAGNOSIS — Z7984 Long term (current) use of oral hypoglycemic drugs: Secondary | ICD-10-CM

## 2023-01-25 DIAGNOSIS — Z79899 Other long term (current) drug therapy: Secondary | ICD-10-CM

## 2023-01-25 DIAGNOSIS — L0291 Cutaneous abscess, unspecified: Principal | ICD-10-CM

## 2023-01-25 DIAGNOSIS — R911 Solitary pulmonary nodule: Secondary | ICD-10-CM | POA: Diagnosis present

## 2023-01-25 DIAGNOSIS — I10 Essential (primary) hypertension: Secondary | ICD-10-CM | POA: Diagnosis present

## 2023-01-25 DIAGNOSIS — Z85118 Personal history of other malignant neoplasm of bronchus and lung: Secondary | ICD-10-CM

## 2023-01-25 DIAGNOSIS — F0393 Unspecified dementia, unspecified severity, with mood disturbance: Secondary | ICD-10-CM | POA: Diagnosis present

## 2023-01-25 DIAGNOSIS — Z7951 Long term (current) use of inhaled steroids: Secondary | ICD-10-CM

## 2023-01-25 DIAGNOSIS — E871 Hypo-osmolality and hyponatremia: Secondary | ICD-10-CM | POA: Diagnosis present

## 2023-01-25 DIAGNOSIS — C3491 Malignant neoplasm of unspecified part of right bronchus or lung: Secondary | ICD-10-CM

## 2023-01-25 DIAGNOSIS — C3490 Malignant neoplasm of unspecified part of unspecified bronchus or lung: Secondary | ICD-10-CM | POA: Diagnosis present

## 2023-01-25 DIAGNOSIS — E119 Type 2 diabetes mellitus without complications: Secondary | ICD-10-CM | POA: Diagnosis present

## 2023-01-25 DIAGNOSIS — F0394 Unspecified dementia, unspecified severity, with anxiety: Secondary | ICD-10-CM | POA: Diagnosis present

## 2023-01-25 DIAGNOSIS — Z87891 Personal history of nicotine dependence: Secondary | ICD-10-CM

## 2023-01-25 DIAGNOSIS — Z79620 Long term (current) use of immunosuppressive biologic: Secondary | ICD-10-CM

## 2023-01-25 LAB — CBC WITH DIFFERENTIAL/PLATELET
Abs Immature Granulocytes: 0.03 10*3/uL (ref 0.00–0.07)
Basophils Absolute: 0 10*3/uL (ref 0.0–0.1)
Basophils Relative: 1 %
Eosinophils Absolute: 0.1 10*3/uL (ref 0.0–0.5)
Eosinophils Relative: 2 %
HCT: 35 % — ABNORMAL LOW (ref 39.0–52.0)
Hemoglobin: 10.9 g/dL — ABNORMAL LOW (ref 13.0–17.0)
Immature Granulocytes: 0 %
Lymphocytes Relative: 27 %
Lymphs Abs: 2.4 10*3/uL (ref 0.7–4.0)
MCH: 28.9 pg (ref 26.0–34.0)
MCHC: 31.1 g/dL (ref 30.0–36.0)
MCV: 92.8 fL (ref 80.0–100.0)
Monocytes Absolute: 0.7 10*3/uL (ref 0.1–1.0)
Monocytes Relative: 7 %
Neutro Abs: 5.6 10*3/uL (ref 1.7–7.7)
Neutrophils Relative %: 63 %
Platelets: 164 10*3/uL (ref 150–400)
RBC: 3.77 MIL/uL — ABNORMAL LOW (ref 4.22–5.81)
RDW: 14.5 % (ref 11.5–15.5)
WBC: 8.8 10*3/uL (ref 4.0–10.5)
nRBC: 0 % (ref 0.0–0.2)

## 2023-01-25 LAB — BASIC METABOLIC PANEL
Anion gap: 16 — ABNORMAL HIGH (ref 5–15)
BUN: 13 mg/dL (ref 8–23)
CO2: 18 mmol/L — ABNORMAL LOW (ref 22–32)
Calcium: 8.8 mg/dL — ABNORMAL LOW (ref 8.9–10.3)
Chloride: 105 mmol/L (ref 98–111)
Creatinine, Ser: 1.05 mg/dL (ref 0.61–1.24)
GFR, Estimated: >60 mL/min (ref >60)
Glucose, Bld: 121 mg/dL — ABNORMAL HIGH (ref 70–99)
Potassium: 3.1 mmol/L — ABNORMAL LOW (ref 3.5–5.1)
Sodium: 139 mmol/L (ref 135–145)

## 2023-01-25 LAB — SEDIMENTATION RATE: Sed Rate: 52 mm/h — ABNORMAL HIGH (ref 0–16)

## 2023-01-25 MED ORDER — PREGABALIN 50 MG PO CAPS
50.0000 mg | ORAL_CAPSULE | Freq: Three times a day (TID) | ORAL | Status: DC
  Administered 2023-01-26 – 2023-01-28 (×9): 50 mg via ORAL
  Filled 2023-01-25 (×9): qty 1

## 2023-01-25 MED ORDER — MOMETASONE FURO-FORMOTEROL FUM 200-5 MCG/ACT IN AERO
2.0000 | INHALATION_SPRAY | Freq: Two times a day (BID) | RESPIRATORY_TRACT | Status: DC
  Administered 2023-01-26: 2 via RESPIRATORY_TRACT
  Filled 2023-01-25: qty 8.8

## 2023-01-25 MED ORDER — VANCOMYCIN HCL 1750 MG/350ML IV SOLN
1750.0000 mg | Freq: Once | INTRAVENOUS | Status: AC
  Administered 2023-01-25: 1750 mg via INTRAVENOUS
  Filled 2023-01-25: qty 350

## 2023-01-25 MED ORDER — ALPRAZOLAM 0.5 MG PO TABS
0.5000 mg | ORAL_TABLET | Freq: Every day | ORAL | Status: DC
  Administered 2023-01-26 – 2023-01-27 (×3): 0.5 mg via ORAL
  Filled 2023-01-25 (×3): qty 1

## 2023-01-25 MED ORDER — ALBUTEROL SULFATE (2.5 MG/3ML) 0.083% IN NEBU
3.0000 mL | INHALATION_SOLUTION | Freq: Two times a day (BID) | RESPIRATORY_TRACT | Status: DC
  Administered 2023-01-26 (×2): 3 mL via RESPIRATORY_TRACT
  Filled 2023-01-25 (×2): qty 3

## 2023-01-25 MED ORDER — INSULIN ASPART 100 UNIT/ML IJ SOLN
0.0000 [IU] | INTRAMUSCULAR | Status: DC
  Administered 2023-01-26: 1 [IU] via SUBCUTANEOUS
  Administered 2023-01-28: 5 [IU] via SUBCUTANEOUS
  Administered 2023-01-28 (×2): 1 [IU] via SUBCUTANEOUS
  Administered 2023-01-28: 3 [IU] via SUBCUTANEOUS
  Administered 2023-01-28: 1 [IU] via SUBCUTANEOUS
  Filled 2023-01-25: qty 0.09

## 2023-01-25 MED ORDER — ONDANSETRON HCL 4 MG/2ML IJ SOLN: 4.0000 mg | Freq: Four times a day (QID) | INTRAMUSCULAR | Status: DC | PRN

## 2023-01-25 MED ORDER — LIDOCAINE 5 % EX PTCH
1.0000 | MEDICATED_PATCH | Freq: Every day | CUTANEOUS | Status: DC
  Administered 2023-01-28: 1 via TRANSDERMAL
  Filled 2023-01-25 (×3): qty 1

## 2023-01-25 MED ORDER — INSULIN GLARGINE-YFGN 100 UNIT/ML ~~LOC~~ SOLN
17.0000 [IU] | Freq: Every day | SUBCUTANEOUS | Status: DC
  Administered 2023-01-26: 17 [IU] via SUBCUTANEOUS
  Filled 2023-01-25 (×3): qty 0.17

## 2023-01-25 MED ORDER — BUSPIRONE HCL 10 MG PO TABS
10.0000 mg | ORAL_TABLET | Freq: Three times a day (TID) | ORAL | Status: DC
  Administered 2023-01-26 – 2023-01-28 (×9): 10 mg via ORAL
  Filled 2023-01-25 (×9): qty 1

## 2023-01-25 MED ORDER — LISINOPRIL 20 MG PO TABS
10.0000 mg | ORAL_TABLET | Freq: Every day | ORAL | Status: DC
  Administered 2023-01-26 – 2023-01-28 (×3): 10 mg via ORAL
  Filled 2023-01-25 (×3): qty 1

## 2023-01-25 MED ORDER — ATORVASTATIN CALCIUM 40 MG PO TABS
40.0000 mg | ORAL_TABLET | Freq: Every day | ORAL | Status: DC
  Administered 2023-01-26 – 2023-01-27 (×3): 40 mg via ORAL
  Filled 2023-01-25 (×3): qty 1

## 2023-01-25 MED ORDER — ACETAMINOPHEN 325 MG PO TABS
650.0000 mg | ORAL_TABLET | Freq: Four times a day (QID) | ORAL | Status: DC | PRN
  Administered 2023-01-26 (×2): 650 mg via ORAL
  Filled 2023-01-25 (×2): qty 2

## 2023-01-25 MED ORDER — FLUOXETINE HCL 20 MG PO CAPS
20.0000 mg | ORAL_CAPSULE | Freq: Every day | ORAL | Status: DC
  Administered 2023-01-26 – 2023-01-28 (×3): 20 mg via ORAL
  Filled 2023-01-25 (×3): qty 1

## 2023-01-25 MED ORDER — SODIUM CHLORIDE 0.9 % IV SOLN
3.0000 g | Freq: Four times a day (QID) | INTRAVENOUS | Status: DC
  Administered 2023-01-26 – 2023-01-28 (×12): 3 g via INTRAVENOUS
  Filled 2023-01-25 (×12): qty 8

## 2023-01-25 MED ORDER — DONEPEZIL HCL 10 MG PO TABS
10.0000 mg | ORAL_TABLET | Freq: Every day | ORAL | Status: DC
  Administered 2023-01-26 – 2023-01-27 (×2): 10 mg via ORAL
  Filled 2023-01-25 (×2): qty 1

## 2023-01-25 MED ORDER — SODIUM CHLORIDE 0.9 % IV SOLN: 1.0000 g | INTRAVENOUS | Status: DC

## 2023-01-25 MED ORDER — UMECLIDINIUM BROMIDE 62.5 MCG/ACT IN AEPB
1.0000 | INHALATION_SPRAY | Freq: Every day | RESPIRATORY_TRACT | Status: DC
  Filled 2023-01-25: qty 7

## 2023-01-25 MED ORDER — IOHEXOL 300 MG/ML  SOLN
80.0000 mL | Freq: Once | INTRAMUSCULAR | Status: AC | PRN
  Administered 2023-01-25: 80 mL via INTRAVENOUS

## 2023-01-25 MED ORDER — TRAZODONE HCL 50 MG PO TABS
200.0000 mg | ORAL_TABLET | Freq: Every day | ORAL | Status: DC
  Administered 2023-01-26 – 2023-01-27 (×3): 200 mg via ORAL
  Filled 2023-01-25: qty 2
  Filled 2023-01-25 (×2): qty 4

## 2023-01-25 MED ORDER — ENOXAPARIN SODIUM 40 MG/0.4ML IJ SOSY
40.0000 mg | PREFILLED_SYRINGE | INTRAMUSCULAR | Status: DC
Start: 2023-01-26 — End: 2023-01-29
  Administered 2023-01-26 – 2023-01-28 (×3): 40 mg via SUBCUTANEOUS
  Filled 2023-01-25 (×3): qty 0.4

## 2023-01-25 MED ORDER — POTASSIUM CHLORIDE CRYS ER 20 MEQ PO TBCR
40.0000 meq | EXTENDED_RELEASE_TABLET | Freq: Once | ORAL | Status: AC
  Administered 2023-01-25: 40 meq via ORAL
  Filled 2023-01-25: qty 2

## 2023-01-25 MED ORDER — VANCOMYCIN HCL 1750 MG/350ML IV SOLN: 1750.0000 mg | INTRAVENOUS | Status: DC

## 2023-01-25 MED ORDER — ONDANSETRON HCL 4 MG PO TABS: 4.0000 mg | ORAL_TABLET | Freq: Four times a day (QID) | ORAL | Status: DC | PRN

## 2023-01-25 MED ORDER — ACETAMINOPHEN 650 MG RE SUPP: 650.0000 mg | Freq: Four times a day (QID) | RECTAL | Status: DC | PRN

## 2023-01-25 MED ORDER — LIDOCAINE VISCOUS HCL 2 % MT SOLN
15.0000 mL | OROMUCOSAL | Status: DC
  Administered 2023-01-26 – 2023-01-27 (×5): 15 mL via OROMUCOSAL
  Filled 2023-01-25 (×15): qty 15

## 2023-01-25 MED ORDER — SODIUM CHLORIDE 0.9 % IV SOLN
1.0000 g | Freq: Once | INTRAVENOUS | Status: AC
  Administered 2023-01-25: 1 g via INTRAVENOUS
  Filled 2023-01-25: qty 10

## 2023-01-25 NOTE — Assessment & Plan Note (Addendum)
Despite metastatic dz to both lungs in 2021, seems to now be essentially in remission as far as we know as of CT late last month (has an 8mm x 8mm RUL nodule that's unchanged on most recent CT chest and that's about it as far as disease findings go). Still on maintenance Keytruda with most recent dose 11/26.

## 2023-01-25 NOTE — H&P (Signed)
History and Physical    Patient: Andrew Ward ZOX:[id number] DOB: 03-Sep-1945 DOA: 01/25/2023 DOS: the patient was seen and examined on 01/25/2023 PCP: Irena Reichmann, DO  Patient coming from: Home  Chief Complaint:  Chief Complaint  Patient presents with   Abscess    Mouth pain   HPI: Andrew Ward is a 77 y.o. male with medical history significant of DM2, HTN, dementia, lung CA.  Pt in to ED with c/o cellulitis of upper lip and lower nose.  Symptoms onset several days ago, progressively worsening.  Has pain in affected area, effecting eating ability.  Sent on to ED due to concern for possible abscess.   Review of Systems: As mentioned in the history of present illness. All other systems reviewed and are negative. Past Medical History:  Diagnosis Date   Anxiety    COPD (chronic obstructive pulmonary disease) (HCC)    Dementia (HCC)    Depression    Diabetes mellitus without complication (HCC)    Hypertension    No past surgical history on file. Social History:  reports that he has quit smoking. His smoking use included cigarettes. He has a 100 pack-year smoking history. He has never used smokeless tobacco. He reports that he does not drink alcohol and does not use drugs.  Allergies  Allergen Reactions   Fentanyl Other (See Comments)    Pt stated it made him feel "weird"   Atorvastatin Other (See Comments)     Myalgias and leg pain   Duloxetine Other (See Comments)    "Not sure what happened" maybe chest got tight    Family History  Problem Relation Age of Onset   Heart disease Neg Hx     Prior to Admission medications   Medication Sig Start Date End Date Taking? Authorizing Provider  acetaminophen (TYLENOL) 500 MG tablet Take 1,000 mg by mouth in the morning, at noon, and at bedtime.   Yes [provider]  ADVAIR DISKUS 250-50 MCG/DOSE AEPB Inhale 1 puff into the lungs 2 (two) times daily. 03/19/20  Yes [provider]  albuterol (PROVENTIL  HFA;VENTOLIN HFA) 108 (90 Base) MCG/ACT inhaler Inhale 2 puffs into the lungs 2 (two) times daily.   Yes [provider]  ALPRAZolam Prudy Feeler) 0.5 MG tablet Take 0.5 mg by mouth See admin instructions. Take 0.5 mg by mouth at bedtime and an additional 0.5 mg once a day as needed for increased agitation   Yes [provider]  antiseptic oral rinse (BIOTENE) LIQD 10 mLs by Mouth Rinse route in the morning and at bedtime.   Yes [provider]  atorvastatin (LIPITOR) 40 MG tablet Take 40 mg by mouth at bedtime.   Yes [provider]  busPIRone (BUSPAR) 10 MG tablet Take 10 mg by mouth 3 (three) times daily.   Yes [provider]  Cholecalciferol (VITAMIN D3) 125 MCG (5000 UT) TABS Take 5,000 Units by mouth daily.   Yes [provider]  DELSYM 30 MG/5ML liquid Take 60 mg by mouth every 12 (twelve) hours as needed for cough.   Yes [provider]  donepezil (ARICEPT) 10 MG tablet Take 10 mg by mouth at bedtime.   Yes [provider]  FLUoxetine (PROZAC) 20 MG capsule Take 20 mg by mouth daily.   Yes [provider]  Glucerna (GLUCERNA) LIQD Take 120 mLs by mouth 2 (two) times daily between meals.   Yes [provider]  INCRUSE ELLIPTA 62.5 MCG/ACT AEPB Inhale 1 puff into the  lungs daily.   Yes [provider]  lidocaine (XYLOCAINE) 2 % solution Use as directed 15 mLs in the mouth or throat See admin instructions. Swish and spit out 15 ml's every 4 hours, while awake. Remove dentures to ensure the lidocaine gets under the tongue.   Yes [provider]  Lidocaine 4 % PTCH Apply 1 patch topically See admin instructions. Apply 1 patch to the back at 9 AM and remove at 9 PM   Yes [provider]  lisinopril (ZESTRIL) 10 MG tablet Take 10 mg by mouth daily.   Yes [provider]  metFORMIN (GLUCOPHAGE) 1000 MG tablet Take 1,000 mg by mouth 2 (two) times daily. 03/28/16  Yes [provider]  pregabalin (LYRICA) 50 MG capsule Take 50 mg by mouth 3 (three) times daily. 05/06/20  Yes [provider]  traZODone (DESYREL) 100 MG tablet Take 200 mg by mouth at bedtime. 05/08/20  Yes [provider]  TRESIBA FLEXTOUCH 100 UNIT/ML FlexTouch Pen Inject 17 Units into the skin at bedtime.   Yes [provider]  azithromycin (ZITHROMAX) 250 MG tablet Take 1 tablet (250 mg total) by mouth daily. Patient not taking: Reported on 01/25/2023 11/22/21   Tilden Fossa, MD  cefdinir (OMNICEF) 300 MG capsule Take 1 capsule (300 mg total) by mouth 2 (two) times daily. Patient not taking: Reported on 01/25/2023 11/22/21   Tilden Fossa, MD    Physical Exam: Vitals:   01/25/23 1542 01/25/23 1547 01/25/23 1815 01/25/23 2216  BP:   (!) 148/78 130/61  Pulse:   78 76  Resp:   16 20  Temp:   98.2 F (36.8 C) 98 F (36.7 C)  TempSrc:   Oral   SpO2: 99%  99% 94%  Weight:  79.4 kg    Height:  6' (1.829 m)     Constitutional: NAD, calm, comfortable Respiratory: clear to auscultation bilaterally, no wheezing, no crackles. Normal respiratory effort. No accessory muscle use.  Cardiovascular: Regular rate and rhythm, no murmurs / rubs / gallops. No extremity edema. 2+ pedal pulses. No carotid bruits.  Abdomen: no tenderness, no masses palpated. No hepatosplenomegaly. Bowel sounds positive.  Skin: Swelling of mid-face around mustache area under nose, TTP, erythema. Neurologic: CN 2-12 grossly intact. Sensation intact, DTR normal. Strength 5/5 in all 4.  Psychiatric: Normal judgment and insight. Alert and oriented x 3. Normal mood.   Data Reviewed:    Labs on Admission: I have personally reviewed following labs and imaging studies  CBC: Recent Labs  Lab 01/25/23 1640  WBC 8.8  NEUTROABS 5.6  HGB 10.9*  HCT 35.0*  MCV 92.8  PLT 164   Basic Metabolic Panel: Recent Labs  Lab 01/25/23 1640  NA 139  K 3.1*  CL 105  CO2 18*  GLUCOSE 121*  BUN 13   CREATININE 1.05  CALCIUM 8.8*   GFR: Estimated Creatinine Clearance: 65.7 mL/min (by C-G formula based on SCr of 1.05 mg/dL). Liver Function Tests: No results for input(s): "AST", "ALT", "ALKPHOS", "BILITOT", "PROT", "ALBUMIN" in the last 168 hours. No results for input(s): "LIPASE", "AMYLASE" in the last 168 hours. No results for input(s): "AMMONIA" in the last 168 hours. Coagulation Profile: No results for input(s): "INR", "PROTIME" in the last 168 hours. Cardiac Enzymes: No results for input(s): "CKTOTAL", "CKMB", "CKMBINDEX", "TROPONINI" in the last 168 hours. BNP (last 3 results) No results for input(s): "PROBNP" in the last 8760 hours. HbA1C: No results for input(s): "HGBA1C" in the last  72 hours. CBG: No results for input(s): "GLUCAP" in the last 168 hours. Lipid Profile: No results for input(s): "CHOL", "HDL", "LDLCALC", "TRIG", "CHOLHDL", "LDLDIRECT" in the last 72 hours. Thyroid Function Tests: No results for input(s): "TSH", "T4TOTAL", "FREET4", "T3FREE", "THYROIDAB" in the last 72 hours. Anemia Panel: No results for input(s): "VITAMINB12", "FOLATE", "FERRITIN", "TIBC", "IRON", "RETICCTPCT" in the last 72 hours. Urine analysis:    Component Value Date/Time   COLORURINE STRAW (A) 10/27/2016 1501   APPEARANCEUR CLEAR 10/27/2016 1501   LABSPEC 1.011 10/27/2016 1501   PHURINE 7.0 10/27/2016 1501   GLUCOSEU >=500 (A) 10/27/2016 1501   HGBUR NEGATIVE 10/27/2016 1501   BILIRUBINUR NEGATIVE 10/27/2016 1501   KETONESUR NEGATIVE 10/27/2016 1501   PROTEINUR NEGATIVE 10/27/2016 1501   NITRITE NEGATIVE 10/27/2016 1501   LEUKOCYTESUR NEGATIVE 10/27/2016 1501    Radiological Exams on Admission: CT Maxillofacial W Contrast  Result Date: 01/25/2023 CLINICAL DATA:  Initial evaluation for nasal abscess. EXAM: CT MAXILLOFACIAL WITH CONTRAST TECHNIQUE: Multidetector CT imaging of the maxillofacial structures was performed with intravenous contrast. Multiplanar CT image  reconstructions were also generated. RADIATION DOSE REDUCTION: This exam was performed according to the departmental dose-optimization program which includes automated exposure control, adjustment of the mA and/or kV according to patient size and/or use of iterative reconstruction technique. CONTRAST:  80mL OMNIPAQUE IOHEXOL 300 MG/ML  SOLN COMPARISON:  None Available. FINDINGS: Osseous: No acute osseous abnormality. No discrete or worrisome osseous lesions. Patient is edentulous. Orbits: Globes and orbital soft tissues within normal limits. No evidence for intraorbital or postseptal cellulitis. Sinuses: Paranasal sinuses are clear. Visualized mastoid air cells and middle ear cavities are well pneumatized and free of fluid. Soft tissues: Diffuse soft tissue swelling seen about the inferior aspect of the nasal vestibule, extending inferiorly to involve the central and right aspect of the upper lip, concerning for localized infection/cellulitis. Subtle superimposed hypodense region at this location measuring 11 x 7 x 9 mm, likely reflecting phlegmon and/or early abscess formation (series 3, image 31). Unclear whether this reflects a frank drainable fluid collection at this time. No other loculated fluid collections or abscess. No other acute inflammatory changes seen about the visualized face. Salivary glands including the parotid and submandibular glands are within normal limits. Moderate to advanced atheromatous change about the visualized carotid bifurcations, worse on the left where there is at least moderate stenosis (series 3, image 11). Limited intracranial: Age-related cerebral atrophy with chronic microvascular ischemic disease. Otherwise unremarkable. IMPRESSION: 1. Diffuse soft tissue swelling about the inferior aspect of the nasal vestibule, extending inferiorly to involve the central and right aspect of the upper lip, concerning for localized infection/cellulitis. Subtle superimposed hypodense region at  this location measuring 11 x 7 x 9 mm, likely reflecting phlegmon and/or early abscess formation. Unclear whether this reflects a frank drainable fluid collection at this time. 2. No other acute abnormality within the face. 3. Moderate to advanced atheromatous change about the visualized carotid bifurcations, worse on the left where there is at least moderate stenosis. Electronically Signed   By: Rise Mu M.D.   On: 01/25/2023 19:54    EKG: Independently reviewed.   Assessment and Plan: * Abscess of lip Abscess of upper lip / lower nose with surrounding cellulitis. Started on rocephin + vanc in ED, will keep pt on unasyn / vanc for the moment While pt doesn't have SIRS / sepsis at this time, Increased agressiveness of ABx therapy at least initially is felt warranted given location of skin and soft  tissue infection (within so called "triangle of danger of the face") Check MRSA PCR nares EDP spoke with Dr. Jearld Fenton: Will see pt [facility] at [facility] for possible drainage Making pt NPO after MN till we know if drainage needed.  Adenocarcinoma of lung (HCC) Despite metastatic dz to both lungs in 2021, seems to now be essentially in remission as far as we know as of CT late last month (has an 8mm x 8mm RUL nodule that's unchanged on most recent CT chest and that's about it as far as disease findings go). Still on maintenance Keytruda with most recent dose 11/26.  Diabetes mellitus without complication (HCC) Hold metformin Sensitive SSI Q4H for the moment  Dementia without behavioral disturbance (HCC) Cont home geri-psych meds      Advance Care Planning:   Code Status: Full Code  Consults: None  Family Communication: No family in room  Severity of Illness: The appropriate patient status for this patient is OBSERVATION. Observation status is judged to be reasonable and necessary in order to provide the required intensity of service to ensure the patient's safety. The patient's presenting  symptoms, physical exam findings, and initial radiographic and laboratory data in the context of their medical condition is felt to place them at decreased risk for further clinical deterioration. Furthermore, it is anticipated that the patient will be medically stable for discharge from the hospital within 2 midnights of admission.   Author: Hillary Bow., DO 01/25/2023 10:46 PM  For on call review [email or web address].

## 2023-01-25 NOTE — Assessment & Plan Note (Signed)
Abscess of upper lip / lower nose with surrounding cellulitis. Started on rocephin + vanc in ED, will keep pt on unasyn / vanc for the moment While pt doesn't have SIRS / sepsis at this time, Increased agressiveness of ABx therapy at least initially is felt warranted given location of skin and soft tissue infection (within so called "triangle of danger of the face") Check MRSA PCR nares EDP spoke with Dr. Jearld Fenton: Will see pt over at [facility] for possible drainage Making pt NPO after MN till we know if drainage needed.

## 2023-01-25 NOTE — Progress Notes (Signed)
ED Pharmacy Antibiotic Sign Off An antibiotic consult was received from an ED provider for Vancomycin per pharmacy dosing for cellulitis. A chart review was completed to assess appropriateness.   The following one time order(s) were placed:  Vancomycin 1750mg  IV x 1  Further antibiotic and/or antibiotic pharmacy consults should be ordered by the admitting provider if indicated.   Thank you for allowing pharmacy to be a part of this patient's care.   Jamse Mead, [facility]  Clinical Pharmacist 01/25/23 8:45 PM

## 2023-01-25 NOTE — Assessment & Plan Note (Addendum)
Hold metformin Sensitive SSI Q4H for the moment. 

## 2023-01-25 NOTE — Progress Notes (Signed)
Pharmacy Antibiotic Note  Andrew Ward is a 77 y.o. male admitted on 01/25/2023 with cellulitis and abscess of face. Pharmacy has been consulted for Vancomycin and Unasyn dosing.  Plan: Vancomycin 1750mg  IV q24h Vancomycin levels at steady state, as indicated Unasyn 3g IV q6h Monitor renal function, cultures, clinical course  Height: 6' (182.9 cm) Weight: 79.4 kg (175 lb 0.7 oz) IBW/kg (Calculated) : 77.6  Temp (24hrs), Avg:98.2 F (36.8 C), Min:98.2 F (36.8 C), Max:98.2 F (36.8 C)  Recent Labs  Lab 01/25/23 1640  WBC 8.8  CREATININE 1.05    Estimated Creatinine Clearance: 65.7 mL/min (by C-G formula based on SCr of 1.05 mg/dL).    Allergies  Allergen Reactions   Fentanyl     Other reaction(s): Other (See Comments) Pt stated it made him feel "weird" Pt stated it made him feel "weird"    Atorvastatin     Other reaction(s): Myalgias (intolerance), Other (See Comments) Leg pain Leg pain    Duloxetine     Other reaction(s): Other (See Comments) "not sure what happened" maybe chest got tight "not sure what happened" maybe chest got tight Unspecified reaction     Antimicrobials this admission: 12/10 Ceftriaxone x 1 12/10 Vancomycin >> 12/10 Unasyn >>  Dose adjustments this admission: --  Microbiology results: 12/10 BCx:  12/10 MRSA PCR:   Thank you for allowing pharmacy to be a part of this patient's care.    Greer Pickerel, PharmD, BCPS Clinical Pharmacist 01/25/2023 9:09 PM

## 2023-01-25 NOTE — ED Provider Notes (Signed)
[place] EMERGENCY DEPARTMENT AT [facility] Provider Note   CSN: [id number] Arrival date & time: 01/25/23  1527     History Chief Complaint  Patient presents with   Abscess    Mouth pain     HPI - Chief Complaint: Facial swelling.  - History of Present Illness: Onset of facial swelling began several days ago, progressively worsening. Swelling is particularly prominent in the mid-face area, under the nose. Significant pain reported in the affected area, severely impacting eating ability. Recently evaluated by a dentist at [facility] who suggested possible abscess, though no definitive diagnosis established. Area is notably tend   Review of Systems   Review of Systems - Pertinent Review of Systems: Denies fever, chills, nausea, and vomiting. No dentures worn despite having them. Unable to wear dentures due to current condition. Experiencing difficulty with oral intake due to swelling and pain. Otherwise normal systemic review. Patient's recorded medical, surgical, social, medication list and allergies were reviewed in the Snapshot window as part of the initial history.  Physical Exam Updated Vital Signs Today's Vitals   01/25/23 1542 01/25/23 1547 01/25/23 1815 01/25/23 2216  BP:   (!) 148/78 130/61  Pulse:   78 76  Resp:   16 20  Temp:   98.2 F (36.8 C) 98 F (36.7 C)  TempSrc:   Oral   SpO2: 99%  99% 94%  Weight:  79.4 kg    Height:  6' (1.829 m)     Body mass index is 23.74 kg/m.  Physical Exam Vitals and nursing note reviewed.  Constitutional:      General: She is not in acute distress.    Appearance: She is well-developed.  HENT:     Head: Normocephalic and atraumatic.     Comments: Observed large swelling of the mid-face, particularly around the mustache area. No obvious abscess or lesion visible inside the mouth. The area under the nose is tender to touch.    Eyes:     Conjunctiva/sclera: Conjunctivae normal.  Cardiovascular:     Rate  and Rhythm: Normal rate and regular rhythm.     Heart sounds: No murmur heard. Pulmonary:     Effort: Pulmonary effort is normal. No respiratory distress.     Breath sounds: Normal breath sounds.  Abdominal:     Palpations: Abdomen is soft.     Tenderness: There is no abdominal tenderness.  Musculoskeletal:        General: No swelling.     Cervical back: Neck supple.  Skin:    General: Skin is warm and dry.     Capillary Refill: Capillary refill takes less than 2 seconds.  Neurological:     Mental Status: She is alert.  Psychiatric:        Mood and Affect: Mood normal.     ED Course/ Medical Decision Making/ A&P    Procedures Procedures   Medications Ordered in ED Medications  enoxaparin (LOVENOX) injection 40 mg (has no administration in time range)  acetaminophen (TYLENOL) tablet 650 mg (has no administration in time range)    Or  acetaminophen (TYLENOL) suppository 650 mg (has no administration in time range)  ondansetron (ZOFRAN) tablet 4 mg (has no administration in time range)    Or  ondansetron (ZOFRAN) injection 4 mg (has no administration in time range)  vancomycin (VANCOREADY) IVPB 1750 mg/350 mL (has no administration in time range)  Ampicillin-Sulbactam (UNASYN) 3 g in sodium chloride 0.9 % 100 mL IVPB (has no  administration in time range)  ALPRAZolam (XANAX) tablet 0.5 mg (has no administration in time range)  mometasone-formoterol (DULERA) 200-5 MCG/ACT inhaler 2 puff (has no administration in time range)  albuterol (PROVENTIL) (2.5 MG/3ML) 0.083% nebulizer solution 3 mL (has no administration in time range)  busPIRone (BUSPAR) tablet 10 mg (has no administration in time range)  atorvastatin (LIPITOR) tablet 40 mg (has no administration in time range)  lisinopril (ZESTRIL) tablet 10 mg (has no administration in time range)  traZODone (DESYREL) tablet 200 mg (has no administration in time range)  insulin glargine-yfgn (SEMGLEE) injection 17 Units (has no  administration in time range)  donepezil (ARICEPT) tablet 10 mg (has no administration in time range)  FLUoxetine (PROZAC) capsule 20 mg (has no administration in time range)  umeclidinium bromide (INCRUSE ELLIPTA) 62.5 MCG/ACT 1 puff (has no administration in time range)  lidocaine (LIDODERM) 5 % 1 patch (has no administration in time range)  lidocaine (XYLOCAINE) 2 % viscous mouth solution 15 mL (has no administration in time range)  pregabalin (LYRICA) capsule 50 mg (has no administration in time range)  insulin aspart (novoLOG) injection 0-9 Units (has no administration in time range)  iohexol (OMNIPAQUE) 300 MG/ML solution 80 mL (80 mLs Intravenous Contrast Given 01/25/23 1846)  cefTRIAXone (ROCEPHIN) 1 g in sodium chloride 0.9 % 100 mL IVPB (0 g Intravenous Stopped 01/25/23 2121)  vancomycin (VANCOREADY) IVPB 1750 mg/350 mL (1,750 mg Intravenous New Bag/Given 01/25/23 2121)  potassium chloride SA (KLOR-CON M) CR tablet 40 mEq (40 mEq Oral Given 01/25/23 2108)    Medical Decision Making:    Andrew Ward is a 77 y.o. male who presented to the ED today with reported facial swelling detailed above.     Handoff received from EMS.  Patient placed on continuous vitals and telemetry monitoring while in ED which was reviewed periodically.   Complete initial physical exam performed, notably the patient  was hemodynamically stable no acute distress.      Reviewed and confirmed nursing documentation for past medical history, family history, social history.    Initial Assessment:   Patient is a present on his physical exam and symptoms consistent with a soft tissue abscess of the midface. Does not appear to involve any posterior oropharyngeal spaces or any other deep space infection.  Appears to be very superficial.  This is most consistent with an acute life/limb threatening illness complicated by underlying chronic conditions.  Initial Plan:  CT max face with contrast to evaluate for  more sinister odontogenic or fistulous infection Screening labs including CBC and Metabolic panel to evaluate for infectious or metabolic etiology of disease.  Objective evaluation as below reviewed with plan for close reassessment  Initial Study Results:   Laboratory  All laboratory results reviewed without evidence of clinically relevant pathology.   Exceptions include: ESR elevation  EKG EKG was reviewed independently. Rate, rhythm, axis, intervals all examined and without medically relevant abnormality. ST segments without concerns for elevations.    Radiology  All images reviewed independently. Agree with radiology report at this time.   CT Maxillofacial W Contrast  Result Date: 01/25/2023 CLINICAL DATA:  Initial evaluation for nasal abscess. EXAM: CT MAXILLOFACIAL WITH CONTRAST TECHNIQUE: Multidetector CT imaging of the maxillofacial structures was performed with intravenous contrast. Multiplanar CT image reconstructions were also generated. RADIATION DOSE REDUCTION: This exam was performed according to the departmental dose-optimization program which includes automated exposure control, adjustment of the mA and/or kV according to patient size and/or use of iterative reconstruction  technique. CONTRAST:  80mL OMNIPAQUE IOHEXOL 300 MG/ML  SOLN COMPARISON:  None Available. FINDINGS: Osseous: No acute osseous abnormality. No discrete or worrisome osseous lesions. Patient is edentulous. Orbits: Globes and orbital soft tissues within normal limits. No evidence for intraorbital or postseptal cellulitis. Sinuses: Paranasal sinuses are clear. Visualized mastoid air cells and middle ear cavities are well pneumatized and free of fluid. Soft tissues: Diffuse soft tissue swelling seen about the inferior aspect of the nasal vestibule, extending inferiorly to involve the central and right aspect of the upper lip, concerning for localized infection/cellulitis. Subtle superimposed hypodense region at this  location measuring 11 x 7 x 9 mm, likely reflecting phlegmon and/or early abscess formation (series 3, image 31). Unclear whether this reflects a frank drainable fluid collection at this time. No other loculated fluid collections or abscess. No other acute inflammatory changes seen about the visualized face. Salivary glands including the parotid and submandibular glands are within normal limits. Moderate to advanced atheromatous change about the visualized carotid bifurcations, worse on the left where there is at least moderate stenosis (series 3, image 11). Limited intracranial: Age-related cerebral atrophy with chronic microvascular ischemic disease. Otherwise unremarkable. IMPRESSION: 1. Diffuse soft tissue swelling about the inferior aspect of the nasal vestibule, extending inferiorly to involve the central and right aspect of the upper lip, concerning for localized infection/cellulitis. Subtle superimposed hypodense region at this location measuring 11 x 7 x 9 mm, likely reflecting phlegmon and/or early abscess formation. Unclear whether this reflects a frank drainable fluid collection at this time. 2. No other acute abnormality within the face. 3. Moderate to advanced atheromatous change about the visualized carotid bifurcations, worse on the left where there is at least moderate stenosis. Electronically Signed   By: Rise Mu M.D.   On: 01/25/2023 19:54     Consults:  Case discussed with Dr. Jearld Fenton of ear nose and throat.   Reassessment and Plan:   Ultimately, patient is a 77 year old male with a facial abscess. Treated with MRSA coverage while obtaining MRSA nasal swabs. Communicated with ENT who recommended admission to main campus for in person consultation.  No acute indication for surgical intervention at this time.  Disposition:   Based on the above findings, I believe this patient is stable for admission.    Patient/family educated about specific findings on our evaluation and  explained exact reasons for admission.  Patient/family educated about clinical situation and time was allowed to answer questions.   Admission team communicated with and agreed with need for admission. Patient admitted. Patient ready to move at this time.     Emergency Department Medication Summary:   Medications  enoxaparin (LOVENOX) injection 40 mg (has no administration in time range)  acetaminophen (TYLENOL) tablet 650 mg (has no administration in time range)    Or  acetaminophen (TYLENOL) suppository 650 mg (has no administration in time range)  ondansetron (ZOFRAN) tablet 4 mg (has no administration in time range)    Or  ondansetron (ZOFRAN) injection 4 mg (has no administration in time range)  vancomycin (VANCOREADY) IVPB 1750 mg/350 mL (has no administration in time range)  Ampicillin-Sulbactam (UNASYN) 3 g in sodium chloride 0.9 % 100 mL IVPB (has no administration in time range)  ALPRAZolam (XANAX) tablet 0.5 mg (has no administration in time range)  mometasone-formoterol (DULERA) 200-5 MCG/ACT inhaler 2 puff (has no administration in time range)  albuterol (PROVENTIL) (2.5 MG/3ML) 0.083% nebulizer solution 3 mL (has no administration in time range)  busPIRone (BUSPAR) tablet  10 mg (has no administration in time range)  atorvastatin (LIPITOR) tablet 40 mg (has no administration in time range)  lisinopril (ZESTRIL) tablet 10 mg (has no administration in time range)  traZODone (DESYREL) tablet 200 mg (has no administration in time range)  insulin glargine-yfgn (SEMGLEE) injection 17 Units (has no administration in time range)  donepezil (ARICEPT) tablet 10 mg (has no administration in time range)  FLUoxetine (PROZAC) capsule 20 mg (has no administration in time range)  umeclidinium bromide (INCRUSE ELLIPTA) 62.5 MCG/ACT 1 puff (has no administration in time range)  lidocaine (LIDODERM) 5 % 1 patch (has no administration in time range)  lidocaine (XYLOCAINE) 2 % viscous mouth  solution 15 mL (has no administration in time range)  pregabalin (LYRICA) capsule 50 mg (has no administration in time range)  insulin aspart (novoLOG) injection 0-9 Units (has no administration in time range)  iohexol (OMNIPAQUE) 300 MG/ML solution 80 mL (80 mLs Intravenous Contrast Given 01/25/23 1846)  cefTRIAXone (ROCEPHIN) 1 g in sodium chloride 0.9 % 100 mL IVPB (0 g Intravenous Stopped 01/25/23 2121)  vancomycin (VANCOREADY) IVPB 1750 mg/350 mL (1,750 mg Intravenous New Bag/Given 01/25/23 2121)  potassium chloride SA (KLOR-CON M) CR tablet 40 mEq (40 mEq Oral Given 01/25/23 2108)         Clinical Impression:  1. Abscess      Admit   Final Clinical Impression(s) / ED Diagnoses Final diagnoses:  Abscess    Rx / DC Orders ED Discharge Orders     None         Glyn Ade, MD 01/25/23 2329

## 2023-01-25 NOTE — ED Triage Notes (Signed)
Patient here for mouth pain from [facility], ongoing for 3 days, per ems facility had dentist come out and was diagnosed with abscess, pt states he has no teeth, seems to hurt more on right upper side

## 2023-01-25 NOTE — Assessment & Plan Note (Signed)
Cont home geri-psych meds

## 2023-01-26 DIAGNOSIS — K130 Diseases of lips: Secondary | ICD-10-CM | POA: Diagnosis present

## 2023-01-26 DIAGNOSIS — Z5329 Procedure and treatment not carried out because of patient's decision for other reasons: Secondary | ICD-10-CM | POA: Diagnosis present

## 2023-01-26 DIAGNOSIS — F32A Depression, unspecified: Secondary | ICD-10-CM | POA: Diagnosis present

## 2023-01-26 DIAGNOSIS — Z79620 Long term (current) use of immunosuppressive biologic: Secondary | ICD-10-CM | POA: Diagnosis not present

## 2023-01-26 DIAGNOSIS — I10 Essential (primary) hypertension: Secondary | ICD-10-CM | POA: Diagnosis present

## 2023-01-26 DIAGNOSIS — E119 Type 2 diabetes mellitus without complications: Secondary | ICD-10-CM | POA: Diagnosis present

## 2023-01-26 DIAGNOSIS — E876 Hypokalemia: Secondary | ICD-10-CM | POA: Diagnosis present

## 2023-01-26 DIAGNOSIS — F0393 Unspecified dementia, unspecified severity, with mood disturbance: Secondary | ICD-10-CM | POA: Diagnosis present

## 2023-01-26 DIAGNOSIS — E871 Hypo-osmolality and hyponatremia: Secondary | ICD-10-CM | POA: Diagnosis present

## 2023-01-26 DIAGNOSIS — Z79899 Other long term (current) drug therapy: Secondary | ICD-10-CM | POA: Diagnosis not present

## 2023-01-26 DIAGNOSIS — K122 Cellulitis and abscess of mouth: Secondary | ICD-10-CM | POA: Diagnosis present

## 2023-01-26 DIAGNOSIS — Z7951 Long term (current) use of inhaled steroids: Secondary | ICD-10-CM | POA: Diagnosis not present

## 2023-01-26 DIAGNOSIS — Z87891 Personal history of nicotine dependence: Secondary | ICD-10-CM | POA: Diagnosis not present

## 2023-01-26 DIAGNOSIS — J449 Chronic obstructive pulmonary disease, unspecified: Secondary | ICD-10-CM | POA: Diagnosis present

## 2023-01-26 DIAGNOSIS — Z7984 Long term (current) use of oral hypoglycemic drugs: Secondary | ICD-10-CM | POA: Diagnosis not present

## 2023-01-26 DIAGNOSIS — Z85118 Personal history of other malignant neoplasm of bronchus and lung: Secondary | ICD-10-CM | POA: Diagnosis not present

## 2023-01-26 DIAGNOSIS — R911 Solitary pulmonary nodule: Secondary | ICD-10-CM | POA: Diagnosis present

## 2023-01-26 DIAGNOSIS — F0394 Unspecified dementia, unspecified severity, with anxiety: Secondary | ICD-10-CM | POA: Diagnosis present

## 2023-01-26 DIAGNOSIS — Z888 Allergy status to other drugs, medicaments and biological substances status: Secondary | ICD-10-CM | POA: Diagnosis not present

## 2023-01-26 LAB — CBC
HCT: 31.8 % — ABNORMAL LOW (ref 39.0–52.0)
Hemoglobin: 10.1 g/dL — ABNORMAL LOW (ref 13.0–17.0)
MCH: 29.3 pg (ref 26.0–34.0)
MCHC: 31.8 g/dL (ref 30.0–36.0)
MCV: 92.2 fL (ref 80.0–100.0)
Platelets: 132 10*3/uL — ABNORMAL LOW (ref 150–400)
RBC: 3.45 MIL/uL — ABNORMAL LOW (ref 4.22–5.81)
RDW: 14.6 % (ref 11.5–15.5)
WBC: 7 10*3/uL (ref 4.0–10.5)
nRBC: 0 % (ref 0.0–0.2)

## 2023-01-26 LAB — BASIC METABOLIC PANEL
Anion gap: 10 (ref 5–15)
BUN: 15 mg/dL (ref 8–23)
CO2: 21 mmol/L — ABNORMAL LOW (ref 22–32)
Calcium: 8.1 mg/dL — ABNORMAL LOW (ref 8.9–10.3)
Chloride: 103 mmol/L (ref 98–111)
Creatinine, Ser: 0.86 mg/dL (ref 0.61–1.24)
GFR, Estimated: >60 mL/min (ref >60)
Glucose, Bld: 111 mg/dL — ABNORMAL HIGH (ref 70–99)
Potassium: 3 mmol/L — ABNORMAL LOW (ref 3.5–5.1)
Sodium: 134 mmol/L — ABNORMAL LOW (ref 135–145)

## 2023-01-26 LAB — MRSA NEXT GEN BY PCR, NASAL: MRSA by PCR Next Gen: DETECTED — AB

## 2023-01-26 LAB — GLUCOSE, CAPILLARY
Glucose-Capillary: 72 mg/dL (ref 70–99)
Glucose-Capillary: 117 mg/dL — ABNORMAL HIGH (ref 70–99)

## 2023-01-26 LAB — CBG MONITORING, ED
Glucose-Capillary: 103 mg/dL — ABNORMAL HIGH (ref 70–99)
Glucose-Capillary: 111 mg/dL — ABNORMAL HIGH (ref 70–99)
Glucose-Capillary: 130 mg/dL — ABNORMAL HIGH (ref 70–99)
Glucose-Capillary: 88 mg/dL (ref 70–99)

## 2023-01-26 LAB — HEMOGLOBIN A1C
Hgb A1c MFr Bld: 5.7 % — ABNORMAL HIGH (ref 4.8–5.6)
Mean Plasma Glucose: 116.89 mg/dL

## 2023-01-26 MED ORDER — MORPHINE SULFATE (PF) 2 MG/ML IV SOLN
2.0000 mg | INTRAVENOUS | Status: DC | PRN
  Administered 2023-01-26 – 2023-01-27 (×2): 2 mg via INTRAVENOUS
  Filled 2023-01-26 (×2): qty 1

## 2023-01-26 MED ORDER — MORPHINE SULFATE (PF) 2 MG/ML IV SOLN
2.0000 mg | Freq: Once | INTRAVENOUS | Status: AC
  Administered 2023-01-26: 2 mg via INTRAMUSCULAR
  Filled 2023-01-26: qty 1

## 2023-01-26 MED ORDER — ALPRAZOLAM 0.5 MG PO TABS
1.0000 mg | ORAL_TABLET | Freq: Once | ORAL | Status: AC
  Administered 2023-01-26: 1 mg via ORAL
  Filled 2023-01-26: qty 2

## 2023-01-26 MED ORDER — HYDROMORPHONE HCL 1 MG/ML IJ SOLN
1.0000 mg | Freq: Once | INTRAMUSCULAR | Status: AC
  Administered 2023-01-26: 1 mg via INTRAVENOUS
  Filled 2023-01-26: qty 1

## 2023-01-26 MED ORDER — HYDRALAZINE HCL 20 MG/ML IJ SOLN: 10.0000 mg | Freq: Four times a day (QID) | INTRAMUSCULAR | Status: DC | PRN

## 2023-01-26 MED ORDER — INSULIN GLARGINE-YFGN 100 UNIT/ML ~~LOC~~ SOLN
17.0000 [IU] | Freq: Every day | SUBCUTANEOUS | Status: DC
  Filled 2023-01-26 (×2): qty 0.17

## 2023-01-26 MED ORDER — TRAMADOL HCL 50 MG PO TABS
25.0000 mg | ORAL_TABLET | Freq: Four times a day (QID) | ORAL | Status: DC | PRN
  Administered 2023-01-26: 25 mg via ORAL
  Filled 2023-01-26: qty 1

## 2023-01-26 MED ORDER — POTASSIUM CHLORIDE CRYS ER 20 MEQ PO TBCR
40.0000 meq | EXTENDED_RELEASE_TABLET | Freq: Once | ORAL | Status: AC
  Administered 2023-01-26: 40 meq via ORAL
  Filled 2023-01-26: qty 2

## 2023-01-26 MED ORDER — DEXTROSE 5 % IV SOLN: INTRAVENOUS | Status: AC

## 2023-01-26 MED ORDER — HYDROCODONE-ACETAMINOPHEN 5-325 MG PO TABS
2.0000 | ORAL_TABLET | Freq: Four times a day (QID) | ORAL | Status: DC | PRN
  Administered 2023-01-26 – 2023-01-28 (×5): 2 via ORAL
  Filled 2023-01-26 (×6): qty 2

## 2023-01-26 MED ORDER — VANCOMYCIN HCL IN DEXTROSE 1-5 GM/200ML-% IV SOLN
1000.0000 mg | Freq: Two times a day (BID) | INTRAVENOUS | Status: DC
  Administered 2023-01-26 – 2023-01-27 (×4): 1000 mg via INTRAVENOUS
  Filled 2023-01-26 (×5): qty 200

## 2023-01-26 NOTE — Progress Notes (Addendum)
PROGRESS NOTE    Andrew Ward  ZOX:[id number] DOB: 06/04/1945 DOA: 01/25/2023 PCP: Irena Reichmann, DO   Brief Narrative: 77 year old with past medical history significant for diabetes type 2, hypertension, dementia, lung cancer who presents complaining of upper lip swelling and nose and redness and pain.  He was found to have upper lip abscess.  Awaiting transfer to [facility]  Plan for I&D tomorrow   Assessment & Plan:   Principal Problem:   Abscess of lip Active Problems:   Dementia without behavioral disturbance (HCC)   Diabetes mellitus without complication (HCC)   Adenocarcinoma of lung (HCC)   1-Abscess upper lip: Continue with vancomycin and Unasyn -ENT consulted, Dr. Jearld Fenton planning I&D in the OR -Awaiting transfer to [facility] -Tramadol for pain control  2-adenocarcinoma of lung: On maintenance Keytruda Follow-up with oncology as an outpatient  Diabetes without complication: He received long-acting insulin 17 units of Semglee,  he is n.p.o. and CBGs in the 80s.  Started on low rate IV fluids.  Check CBG.    Hypertension: Continue with lisinopril Added PRN Hydralazine   COPD: Continue with Incruse and Dulera  Dementia without behavioral disturbance: Continue with Lyrica, tramadol and trazodone  Hypokalemia: Replete orally. Mild hyponatremia: Monitor  Estimated body mass index is 23.74 kg/m as calculated from the following:   Height as of this encounter: 6' (1.829 m).   Weight as of this encounter: 79.4 kg.   DVT prophylaxis: Lovenox Code Status: Full code Family Communication: Disposition Plan:  Status is: Observation The patient remains OBS appropriate and will d/c before 2 midnights.    Consultants:  None  Procedures:  none  Antimicrobials:    Subjective: He is alert, report lip pain.   Objective: Vitals:   01/26/23 0200 01/26/23 0343 01/26/23 0600 01/26/23 0610  BP:  (!) 118/58 (!) 162/97   Pulse:  82 76   Resp:  18  18   Temp: 98 F (36.7 C)   98.2 F (36.8 C)  TempSrc:      SpO2:  97% 100%   Weight:      Height:        Intake/Output Summary (Last 24 hours) at 01/26/2023 0851 Last data filed at 01/25/2023 2121 Gross per 24 hour  Intake 96.67 ml  Output --  Net 96.67 ml   Filed Weights   01/25/23 1547  Weight: 79.4 kg    Examination:  General exam: Appears calm and comfortable, Upper lip with redness, crusting area, redness.  Respiratory system: Clear to auscultation. Respiratory effort normal. Cardiovascular system: S1 & S2 heard, RRR. No JVD, murmurs, rubs, gallops or clicks. No pedal edema. Gastrointestinal system: Abdomen is nondistended, soft and nontender. No organomegaly or masses felt. Normal bowel sounds heard. Central nervous system: Alert and oriented. No focal neurological deficits. Extremities: Symmetric 5 x 5 power.   Data Reviewed: I have personally reviewed following labs and imaging studies  CBC: Recent Labs  Lab 01/25/23 1640 01/26/23 0518  WBC 8.8 7.0  NEUTROABS 5.6  --   HGB 10.9* 10.1*  HCT 35.0* 31.8*  MCV 92.8 92.2  PLT 164 132*   Basic Metabolic Panel: Recent Labs  Lab 01/25/23 1640 01/26/23 0518  NA 139 134*  K 3.1* 3.0*  CL 105 103  CO2 18* 21*  GLUCOSE 121* 111*  BUN 13 15  CREATININE 1.05 0.86  CALCIUM 8.8* 8.1*   GFR: Estimated Creatinine Clearance: 80.2 mL/min (by C-G formula based on SCr of 0.86 mg/dL). Liver Function  Tests: No results for input(s): "AST", "ALT", "ALKPHOS", "BILITOT", "PROT", "ALBUMIN" in the last 168 hours. No results for input(s): "LIPASE", "AMYLASE" in the last 168 hours. No results for input(s): "AMMONIA" in the last 168 hours. Coagulation Profile: No results for input(s): "INR", "PROTIME" in the last 168 hours. Cardiac Enzymes: No results for input(s): "CKTOTAL", "CKMB", "CKMBINDEX", "TROPONINI" in the last 168 hours. BNP (last 3 results) No results for input(s): "PROBNP" in the last 8760  hours. HbA1C: Recent Labs    01/26/23 0007  HGBA1C 5.7*   CBG: Recent Labs  Lab 01/26/23 0016 01/26/23 0341 01/26/23 0833  GLUCAP 103* 111* 88   Lipid Profile: No results for input(s): "CHOL", "HDL", "LDLCALC", "TRIG", "CHOLHDL", "LDLDIRECT" in the last 72 hours. Thyroid Function Tests: No results for input(s): "TSH", "T4TOTAL", "FREET4", "T3FREE", "THYROIDAB" in the last 72 hours. Anemia Panel: No results for input(s): "VITAMINB12", "FOLATE", "FERRITIN", "TIBC", "IRON", "RETICCTPCT" in the last 72 hours. Sepsis Labs: No results for input(s): "PROCALCITON", "LATICACIDVEN" in the last 168 hours.  Recent Results (from the past 240 hour(s))  MRSA Next Gen by PCR, Nasal     Status: Abnormal   Collection Time: 01/25/23  8:51 PM   Specimen: Peripheral; Nasal Swab  Result Value Ref Range Status   MRSA by PCR Next Gen DETECTED (A) NOT DETECTED Final    Comment: (NOTE) The GeneXpert MRSA Assay (FDA approved for NASAL specimens only), is one component of a comprehensive MRSA colonization surveillance program. It is not intended to diagnose MRSA infection nor to guide or monitor treatment for MRSA infections. Test performance is not FDA approved in patients less than 60 years old. Performed at [facility], [address], [place], [place] [address]          Radiology Studies: CT Maxillofacial W Contrast  Result Date: 01/25/2023 CLINICAL DATA:  Initial evaluation for nasal abscess. EXAM: CT MAXILLOFACIAL WITH CONTRAST TECHNIQUE: Multidetector CT imaging of the maxillofacial structures was performed with intravenous contrast. Multiplanar CT image reconstructions were also generated. RADIATION DOSE REDUCTION: This exam was performed according to the departmental dose-optimization program which includes automated exposure control, adjustment of the mA and/or kV according to patient size and/or use of iterative reconstruction technique. CONTRAST:  80mL OMNIPAQUE  IOHEXOL 300 MG/ML  SOLN COMPARISON:  None Available. FINDINGS: Osseous: No acute osseous abnormality. No discrete or worrisome osseous lesions. Patient is edentulous. Orbits: Globes and orbital soft tissues within normal limits. No evidence for intraorbital or postseptal cellulitis. Sinuses: Paranasal sinuses are clear. Visualized mastoid air cells and middle ear cavities are well pneumatized and free of fluid. Soft tissues: Diffuse soft tissue swelling seen about the inferior aspect of the nasal vestibule, extending inferiorly to involve the central and right aspect of the upper lip, concerning for localized infection/cellulitis. Subtle superimposed hypodense region at this location measuring 11 x 7 x 9 mm, likely reflecting phlegmon and/or early abscess formation (series 3, image 31). Unclear whether this reflects a frank drainable fluid collection at this time. No other loculated fluid collections or abscess. No other acute inflammatory changes seen about the visualized face. Salivary glands including the parotid and submandibular glands are within normal limits. Moderate to advanced atheromatous change about the visualized carotid bifurcations, worse on the left where there is at least moderate stenosis (series 3, image 11). Limited intracranial: Age-related cerebral atrophy with chronic microvascular ischemic disease. Otherwise unremarkable. IMPRESSION: 1. Diffuse soft tissue swelling about the inferior aspect of the nasal vestibule, extending inferiorly to involve  the central and right aspect of the upper lip, concerning for localized infection/cellulitis. Subtle superimposed hypodense region at this location measuring 11 x 7 x 9 mm, likely reflecting phlegmon and/or early abscess formation. Unclear whether this reflects a frank drainable fluid collection at this time. 2. No other acute abnormality within the face. 3. Moderate to advanced atheromatous change about the visualized carotid bifurcations, worse on  the left where there is at least moderate stenosis. Electronically Signed   By: Rise Mu M.D.   On: 01/25/2023 19:54        Scheduled Meds:  albuterol  3 mL Inhalation BID   ALPRAZolam  0.5 mg Oral QHS   atorvastatin  40 mg Oral QHS   busPIRone  10 mg Oral TID   donepezil  10 mg Oral QHS   enoxaparin (LOVENOX) injection  40 mg Subcutaneous Q24H   FLUoxetine  20 mg Oral Daily   insulin aspart  0-9 Units Subcutaneous Q4H   insulin glargine-yfgn  17 Units Subcutaneous QHS   lidocaine  1 patch Transdermal Daily   lidocaine  15 mL Mouth/Throat Q4H while awake   lisinopril  10 mg Oral Daily   mometasone-formoterol  2 puff Inhalation BID   pregabalin  50 mg Oral TID   traZODone  200 mg Oral QHS   umeclidinium bromide  1 puff Inhalation Daily   Continuous Infusions:  ampicillin-sulbactam (UNASYN) IV Stopped (01/26/23 0619)   vancomycin       LOS: 0 days    Time spent: 35 Minutes    Adonna Horsley A Dario Yono, MD [facility]   If 7PM-7AM, please contact night-coverage [email or web address]  01/26/2023, 8:51 AM

## 2023-01-26 NOTE — Consult Note (Signed)
Reason for Consult:lip abscess Referring Physician: Dr Abbey Chatters Andrew Ward is an 77 y.o. male.  HPI: hx of lip swelling and seen by dentist at his facility. Patient has no teeth. Hje has had a skin lesion on the upper lip that started this. He is having significant pain. No previous. He is waiting transfer to [facility]  Past Medical History:  Diagnosis Date   Anxiety    COPD (chronic obstructive pulmonary disease) (HCC)    Dementia (HCC)    Depression    Diabetes mellitus without complication (HCC)    Hypertension     No past surgical history on file.  Family History  Problem Relation Age of Onset   Heart disease Neg Hx     Social History:  reports that he has quit smoking. His smoking use included cigarettes. He has a 100 pack-year smoking history. He has never used smokeless tobacco. He reports that he does not drink alcohol and does not use drugs.  Allergies:  Allergies  Allergen Reactions   Fentanyl Other (See Comments)    Pt stated it made him feel "weird"   Atorvastatin Other (See Comments)     Myalgias and leg pain   Duloxetine Other (See Comments)    "Not sure what happened" maybe chest got tight    Medications: I have reviewed the patient's current medications.  Results for orders placed or performed during the hospital encounter of 01/25/23 (from the past 48 hour(s))  CBC with Differential     Status: Abnormal   Collection Time: 01/25/23  4:40 PM  Result Value Ref Range   WBC 8.8 4.0 - 10.5 K/uL   RBC 3.77 (L) 4.22 - 5.81 MIL/uL   Hemoglobin 10.9 (L) 13.0 - 17.0 g/dL   HCT 29.5 (L) 62.1 - 30.8 %   MCV 92.8 80.0 - 100.0 fL   MCH 28.9 26.0 - 34.0 pg   MCHC 31.1 30.0 - 36.0 g/dL   RDW 65.7 84.6 - 96.2 %   Platelets 164 150 - 400 K/uL   nRBC 0.0 0.0 - 0.2 %   Neutrophils Relative % 63 %   Neutro Abs 5.6 1.7 - 7.7 K/uL   Lymphocytes Relative 27 %   Lymphs Abs 2.4 0.7 - 4.0 K/uL   Monocytes Relative 7 %   Monocytes Absolute 0.7 0.1 - 1.0 K/uL   Eosinophils  Relative 2 %   Eosinophils Absolute 0.1 0.0 - 0.5 K/uL   Basophils Relative 1 %   Basophils Absolute 0.0 0.0 - 0.1 K/uL   Immature Granulocytes 0 %   Abs Immature Granulocytes 0.03 0.00 - 0.07 K/uL    Comment: Performed at [facility], [address] [place], [place] [address]  Basic metabolic panel     Status: Abnormal   Collection Time: 01/25/23  4:40 PM  Result Value Ref Range   Sodium 139 135 - 145 mmol/L   Potassium 3.1 (L) 3.5 - 5.1 mmol/L   Chloride 105 98 - 111 mmol/L   CO2 18 (L) 22 - 32 mmol/L   Glucose, Bld 121 (H) 70 - 99 mg/dL    Comment: Glucose reference range applies only to samples taken after fasting for at least 8 hours.   BUN 13 8 - 23 mg/dL   Creatinine, Ser 1.32 0.61 - 1.24 mg/dL   Calcium 8.8 (L) 8.9 - 10.3 mg/dL   GFR, Estimated >44 >01 mL/min    Comment: (NOTE) Calculated using the CKD-EPI Creatinine Equation (2021)  Anion gap 16 (H) 5 - 15    Comment: Performed at [facility], [address], [place], [place] [address]  Sedimentation rate     Status: Abnormal   Collection Time: 01/25/23  4:40 PM  Result Value Ref Range   Sed Rate 52 (H) 0 - 16 mm/hr    Comment: Performed at [facility], [address], [place], [place] [address]  MRSA Next Gen by PCR, Nasal     Status: Abnormal   Collection Time: 01/25/23  8:51 PM   Specimen: Peripheral; Nasal Swab  Result Value Ref Range   MRSA by PCR Next Gen DETECTED (A) NOT DETECTED    Comment: (NOTE) The GeneXpert MRSA Assay (FDA approved for NASAL specimens only), is one component of a comprehensive MRSA colonization surveillance program. It is not intended to diagnose MRSA infection nor to guide or monitor treatment for MRSA infections. Test performance is not FDA approved in patients less than 17 years old. Performed at [facility], [address], [place], [place] [address]   Hemoglobin A1c     Status: Abnormal    Collection Time: 01/26/23 12:07 AM  Result Value Ref Range   Hgb A1c MFr Bld 5.7 (H) 4.8 - 5.6 %    Comment: (NOTE) Pre diabetes:          5.7%-6.4%  Diabetes:              >6.4%  Glycemic control for   <7.0% adults with diabetes    Mean Plasma Glucose 116.89 mg/dL    Comment: Performed at [facility], [address], [place], [place] [address]  CBG monitoring, ED     Status: Abnormal   Collection Time: 01/26/23 12:16 AM  Result Value Ref Range   Glucose-Capillary 103 (H) 70 - 99 mg/dL    Comment: Glucose reference range applies only to samples taken after fasting for at least 8 hours.  CBG monitoring, ED     Status: Abnormal   Collection Time: 01/26/23  3:41 AM  Result Value Ref Range   Glucose-Capillary 111 (H) 70 - 99 mg/dL    Comment: Glucose reference range applies only to samples taken after fasting for at least 8 hours.  CBC     Status: Abnormal   Collection Time: 01/26/23  5:18 AM  Result Value Ref Range   WBC 7.0 4.0 - 10.5 K/uL   RBC 3.45 (L) 4.22 - 5.81 MIL/uL   Hemoglobin 10.1 (L) 13.0 - 17.0 g/dL   HCT 13.0 (L) 86.5 - 78.4 %   MCV 92.2 80.0 - 100.0 fL   MCH 29.3 26.0 - 34.0 pg   MCHC 31.8 30.0 - 36.0 g/dL   RDW 69.6 29.5 - 28.4 %   Platelets 132 (L) 150 - 400 K/uL   nRBC 0.0 0.0 - 0.2 %    Comment: Performed at [facility], [address] [place], [place] [address]  Basic metabolic panel     Status: Abnormal   Collection Time: 01/26/23  5:18 AM  Result Value Ref Range   Sodium 134 (L) 135 - 145 mmol/L   Potassium 3.0 (L) 3.5 - 5.1 mmol/L   Chloride 103 98 - 111 mmol/L   CO2 21 (L) 22 - 32 mmol/L   Glucose, Bld 111 (H) 70 - 99 mg/dL    Comment: Glucose reference range applies only to samples taken after fasting for at least 8 hours.   BUN  15 8 - 23 mg/dL   Creatinine, Ser 1.61 0.61 - 1.24 mg/dL   Calcium 8.1 (L) 8.9 - 10.3 mg/dL   GFR, Estimated >09 >60 mL/min    Comment: (NOTE) Calculated using the CKD-EPI Creatinine Equation  (2021)    Anion gap 10 5 - 15    Comment: Performed at [facility], [address] [place], [place] [address]  CBG monitoring, ED     Status: None   Collection Time: 01/26/23  8:33 AM  Result Value Ref Range   Glucose-Capillary 88 70 - 99 mg/dL    Comment: Glucose reference range applies only to samples taken after fasting for at least 8 hours.    CT Maxillofacial W Contrast  Result Date: 01/25/2023 CLINICAL DATA:  Initial evaluation for nasal abscess. EXAM: CT MAXILLOFACIAL WITH CONTRAST TECHNIQUE: Multidetector CT imaging of the maxillofacial structures was performed with intravenous contrast. Multiplanar CT image reconstructions were also generated. RADIATION DOSE REDUCTION: This exam was performed according to the departmental dose-optimization program which includes automated exposure control, adjustment of the mA and/or kV according to patient size and/or use of iterative reconstruction technique. CONTRAST:  80mL OMNIPAQUE IOHEXOL 300 MG/ML  SOLN COMPARISON:  None Available. FINDINGS: Osseous: No acute osseous abnormality. No discrete or worrisome osseous lesions. Patient is edentulous. Orbits: Globes and orbital soft tissues within normal limits. No evidence for intraorbital or postseptal cellulitis. Sinuses: Paranasal sinuses are clear. Visualized mastoid air cells and middle ear cavities are well pneumatized and free of fluid. Soft tissues: Diffuse soft tissue swelling seen about the inferior aspect of the nasal vestibule, extending inferiorly to involve the central and right aspect of the upper lip, concerning for localized infection/cellulitis. Subtle superimposed hypodense region at this location measuring 11 x 7 x 9 mm, likely reflecting phlegmon and/or early abscess formation (series 3, image 31). Unclear whether this reflects a frank drainable fluid collection at this time. No other loculated fluid collections or abscess. No other acute inflammatory changes seen  about the visualized face. Salivary glands including the parotid and submandibular glands are within normal limits. Moderate to advanced atheromatous change about the visualized carotid bifurcations, worse on the left where there is at least moderate stenosis (series 3, image 11). Limited intracranial: Age-related cerebral atrophy with chronic microvascular ischemic disease. Otherwise unremarkable. IMPRESSION: 1. Diffuse soft tissue swelling about the inferior aspect of the nasal vestibule, extending inferiorly to involve the central and right aspect of the upper lip, concerning for localized infection/cellulitis. Subtle superimposed hypodense region at this location measuring 11 x 7 x 9 mm, likely reflecting phlegmon and/or early abscess formation. Unclear whether this reflects a frank drainable fluid collection at this time. 2. No other acute abnormality within the face. 3. Moderate to advanced atheromatous change about the visualized carotid bifurcations, worse on the left where there is at least moderate stenosis. Electronically Signed   By: Rise Mu M.D.   On: 01/25/2023 19:54    ROS Blood pressure (!) 162/97, pulse 76, temperature 98.2 F (36.8 C), resp. rate 18, height 6' (1.829 m), weight 79.4 kg, SpO2 100%. Physical Exam HENT:     Head: Normocephalic.     Right Ear: External ear normal.     Left Ear: External ear normal.     Nose:     Comments: Very indurated area of the entire upper lip. Very tender. There is a crusted wound on the left side of the upper lip. About 3 cm of induration by palpation of  the area    Mouth/Throat:     Mouth: Mucous membranes are moist.  Eyes:     Pupils: Pupils are equal, round, and reactive to light.  Neurological:     Mental Status: He is alert.       Assessment/Plan: Lip abscess- he is very tender and has significant induration. Most likely he will need I/D which he would not tolerate at the bedside. He is waiting transfer to [facility] so he can  go to OR. Will plan on today vancomycin and schedule for tomorrow.  Suzanna Obey 01/26/2023, 9:18 AM

## 2023-01-26 NOTE — Progress Notes (Signed)
Patient has arrived to [facility].  I evaluated him at bedside.  He is continuing to complain of pain nonresponsive to morphine and tramadol.  I had given for 1 mg push of IV Dilaudid and changed tramadol to Norco.  Patient is also complaining of anxiety.  Will give one-time dose of Xanax.  Place patient on continuous pulse ox monitor.  Continue IV antibiotics and follow recommendations from ENT.  Planning for I&D in the morning.   Today's Vitals   01/26/23 1443 01/26/23 1500 01/26/23 1546 01/26/23 1730  BP: (!) 191/100     Pulse: 73     Resp:      Temp: 98 F (36.7 C)     TempSrc:      SpO2: 100%     Weight:      Height:      PainSc:  10-Worst pain ever 10-Worst pain ever 6    Body mass index is 23.74 kg/m.

## 2023-01-26 NOTE — ED Notes (Signed)
Assisted pt to bathroom, pt used walker pt was very unsteady

## 2023-01-26 NOTE — ED Notes (Signed)
RN contacted receiving department to inform them abut the patient. RN was notified by staff that no additional information was needed.

## 2023-01-26 NOTE — Progress Notes (Signed)
Pharmacy Antibiotic Note  Andrew Ward is a 77 y.o. male admitted on 01/25/2023 with cellulitis and abscess of face. Pharmacy has been consulted for Vancomycin and Unasyn dosing.  Today, 01/26/23 Serum creatinine has improved to 0.86 and est CrCl 63ml/min.    Plan: Change vancomycin to 1000 mg IV q12h (Goal AUC 400-550, new eAUC 492.9, new SCr used: 0.86)  Vancomycin levels at steady state, as indicated Unasyn 3g IV q6h Monitor renal function, cultures, clinical course  Height: 6' (182.9 cm) Weight: 79.4 kg (175 lb 0.7 oz) IBW/kg (Calculated) : 77.6  Temp (24hrs), Avg:98.1 F (36.7 C), Min:98 F (36.7 C), Max:98.2 F (36.8 C)  Recent Labs  Lab 01/25/23 1640 01/26/23 0518  WBC 8.8 7.0  CREATININE 1.05 0.86    Estimated Creatinine Clearance: 80.2 mL/min (by C-G formula based on SCr of 0.86 mg/dL).    Allergies  Allergen Reactions   Fentanyl Other (See Comments)    Pt stated it made him feel "weird"   Atorvastatin Other (See Comments)     Myalgias and leg pain   Duloxetine Other (See Comments)    "Not sure what happened" maybe chest got tight    Antimicrobials this admission: 12/10 Ceftriaxone x 1 12/10 Vancomycin >> 12/10 Unasyn >>  Dose adjustments this admission: 12/11 change vancomycin to 1000 mg IV q12h  Microbiology results: 12/10 BCx: NGTD 12/10 MRSA PCR: detected    Thank you for allowing pharmacy to be a part of this patient's care.  Selinda Eon, PharmD, BCPS Clinical Pharmacist [facility] 01/26/2023 10:19 AM

## 2023-01-27 ENCOUNTER — Encounter (HOSPITAL_COMMUNITY)
Admission: EM | Disposition: A | Discharge status: Skilled Nursing Facility | Payer: Self-pay | Source: Home / Self Care | Attending: Family Medicine

## 2023-01-27 ENCOUNTER — Encounter (HOSPITAL_COMMUNITY): Payer: Self-pay | Admitting: Certified Registered"

## 2023-01-27 DIAGNOSIS — K130 Diseases of lips: Secondary | ICD-10-CM | POA: Diagnosis not present

## 2023-01-27 LAB — GLUCOSE, CAPILLARY
Glucose-Capillary: 120 mg/dL — ABNORMAL HIGH (ref 70–99)
Glucose-Capillary: 129 mg/dL — ABNORMAL HIGH (ref 70–99)
Glucose-Capillary: 134 mg/dL — ABNORMAL HIGH (ref 70–99)
Glucose-Capillary: 136 mg/dL — ABNORMAL HIGH (ref 70–99)
Glucose-Capillary: 165 mg/dL — ABNORMAL HIGH (ref 70–99)
Glucose-Capillary: 188 mg/dL — ABNORMAL HIGH (ref 70–99)
Glucose-Capillary: 216 mg/dL — ABNORMAL HIGH (ref 70–99)

## 2023-01-27 LAB — CBC
HCT: 31.8 % — ABNORMAL LOW (ref 39.0–52.0)
Hemoglobin: 10.1 g/dL — ABNORMAL LOW (ref 13.0–17.0)
MCH: 28.9 pg (ref 26.0–34.0)
MCHC: 31.8 g/dL (ref 30.0–36.0)
MCV: 91.1 fL (ref 80.0–100.0)
Platelets: 146 10*3/uL — ABNORMAL LOW (ref 150–400)
RBC: 3.49 MIL/uL — ABNORMAL LOW (ref 4.22–5.81)
RDW: 14.6 % (ref 11.5–15.5)
WBC: 9.3 10*3/uL (ref 4.0–10.5)
nRBC: 0 % (ref 0.0–0.2)

## 2023-01-27 LAB — BASIC METABOLIC PANEL
Anion gap: 10 (ref 5–15)
BUN: 8 mg/dL (ref 8–23)
CO2: 21 mmol/L — ABNORMAL LOW (ref 22–32)
Calcium: 8 mg/dL — ABNORMAL LOW (ref 8.9–10.3)
Chloride: 105 mmol/L (ref 98–111)
Creatinine, Ser: 0.88 mg/dL (ref 0.61–1.24)
GFR, Estimated: >60 mL/min (ref >60)
Glucose, Bld: 181 mg/dL — ABNORMAL HIGH (ref 70–99)
Potassium: 3.4 mmol/L — ABNORMAL LOW (ref 3.5–5.1)
Sodium: 136 mmol/L (ref 135–145)

## 2023-01-27 SURGERY — IRRIGATION AND DEBRIDEMENT ABSCESS
Anesthesia: Choice

## 2023-01-27 MED ORDER — ALBUTEROL SULFATE (2.5 MG/3ML) 0.083% IN NEBU: 3.0000 mL | INHALATION_SOLUTION | RESPIRATORY_TRACT | Status: DC | PRN

## 2023-01-27 MED ORDER — SODIUM CHLORIDE 0.9 % IV SOLN: INTRAVENOUS | Status: AC

## 2023-01-27 NOTE — Anesthesia Preprocedure Evaluation (Signed)
Anesthesia Evaluation    Reviewed: Allergy & Precautions, Patient's Chart, lab work & pertinent test results  Airway        Dental   Pulmonary COPD,  COPD inhaler 100 pack year history           Cardiovascular hypertension, Pt. on medications      Neuro/Psych  PSYCHIATRIC DISORDERS Anxiety Depression Bipolar Disorder  Dementia negative neurological ROS     GI/Hepatic negative GI ROS, Neg liver ROS,,,  Endo/Other  diabetes, Well Controlled, Type 2, Insulin Dependent, Oral Hypoglycemic Agents    Renal/GU negative Renal ROS  negative genitourinary   Musculoskeletal negative musculoskeletal ROS (+)    Abdominal   Peds  Hematology  (+) Blood dyscrasia, anemia Hb 10.1, plt 146   Anesthesia Other Findings Cellulitis, abscess of face  Reproductive/Obstetrics negative OB ROS                              Anesthesia Physical Anesthesia Plan  ASA: 3  Anesthesia Plan: General   Post-op Pain Management: Tylenol PO (pre-op)*   Induction: Intravenous  PONV Risk Score and Plan: 2 and Ondansetron, Dexamethasone, Midazolam and Treatment may vary due to age or medical condition  Airway Management Planned: Oral ETT  Additional Equipment: None  Intra-op Plan:   Post-operative Plan: Extubation in OR  Informed Consent:   Plan Discussed with:   Anesthesia Plan Comments:          Anesthesia Quick Evaluation

## 2023-01-27 NOTE — Progress Notes (Signed)
PROGRESS NOTE    Andrew Ward  JWJ:[id number] DOB: 11/21/1945 DOA: 01/25/2023 PCP: Irena Reichmann, DO  Chief Complaint  Patient presents with   Abscess    Mouth pain    Hospital Course:  [place] Andrew Ward is 77 y.o. male with history of type 2 diabetes, hypertension, dementia, lung cancer in remission, who presents to [facility] ED complaining of upper lip swelling with surrounding erythema and nose and cheeks.  He was found to have an upper lip abscess.  ENT was consulted who requested admission to [facility] for I&D.  Patient has had difficult to control pain since arrival.  Subjective: Patient to be taken to the OR this morning for I&D however he refused procedure and instead requested to proceed with antibiotics only.  He is still complaining of pain on my evaluation.  Though he does report that it is improving some  Objective: Vitals:   01/27/23 0500 01/27/23 0536 01/27/23 0759 01/27/23 1617  BP:  115/60 (!) 145/76 (!) 169/80  Pulse:  82 79 84  Resp:  18 18 18   Temp:   98 F (36.7 C) 98 F (36.7 C)  TempSrc:      SpO2:  99% 99% 98%  Weight: 69 kg     Height:        Intake/Output Summary (Last 24 hours) at 01/27/2023 1659 Last data filed at 01/26/2023 2233 Gross per 24 hour  Intake 1100 ml  Output --  Net 1100 ml   Filed Weights   01/25/23 1547 01/27/23 0500  Weight: 79.4 kg 69 kg    Examination: General exam: Appears calm and comfortable, NAD, thin and appears chronically ill Respiratory system: No work of breathing, symmetric chest wall expansion Cardiovascular system: S1 & S2 heard, RRR.  Gastrointestinal system: Abdomen is nondistended, soft and nontender.  Neuro: Alert and oriented. No focal neurological deficits. Extremities: Symmetric, expected ROM Skin: Upper lip with crusting abscess, very tender to palpation, surrounding erythema and induration, maxillary sinus also mildly tender to palpation Psychiatry: Demonstrates appropriate judgement and  insight. Mood & affect appropriate for situation.   Assessment & Plan:  Principal Problem:   Abscess of lip Active Problems:   Dementia without behavioral disturbance (HCC)   Diabetes mellitus without complication (HCC)   Adenocarcinoma of lung (HCC)    Abscess of upper lip - MRSA PCR is positive, continue vancomycin and Unasyn for now - ENT consulted, Dr. Jearld Fenton planning to take patient for I&D in the OR however he refused today.  Will continue to reassess after antibiotics - Continue pain control  Adenocarcinoma of the lung - Reportedly in remission - - On maintenance Keytruda - Continue following with oncology as an outpatient  Diabetes without complication - Continue basal/bolus, titrate up as tolerated  Hypertension - Continue home dose meds -- PRN Hydralazine  COPD - Continue Incruse and Dulera  Dementia without behavioral disturbance Bipolar Depression - Continue with home meds - Frequent reorientation - Delirium precautions  Hypokalemia Hyponatremia - Replace as needed - Follow CMP  DVT prophylaxis: Lovenox   Code Status: Full Code Family Communication: none at bedside. Disposition:  Status is: Inpatient, IV abx    Consultants:  ENT    Procedures:  pending  Antimicrobials:  Anti-infectives (From admission, onward)    Start     Dose/Rate Route Frequency Ordered Stop   01/26/23 2200  vancomycin (VANCOREADY) IVPB 1750 mg/350 mL  Status:  Discontinued        1,750 mg 175 mL/hr over 120  Minutes Intravenous Every 24 hours 01/25/23 2107 01/26/23 1010   01/26/23 2100  cefTRIAXone (ROCEPHIN) 1 g in sodium chloride 0.9 % 100 mL IVPB  Status:  Discontinued        1 g 200 mL/hr over 30 Minutes Intravenous Every 24 hours 01/25/23 2049 01/25/23 2049   01/26/23 1015  vancomycin (VANCOCIN) IVPB 1000 mg/200 mL premix        1,000 mg 200 mL/hr over 60 Minutes Intravenous Every 12 hours 01/26/23 1011     01/25/23 2200  Ampicillin-Sulbactam (UNASYN) 3 g in  sodium chloride 0.9 % 100 mL IVPB        3 g 200 mL/hr over 30 Minutes Intravenous Every 6 hours 01/25/23 2107     01/25/23 2045  cefTRIAXone (ROCEPHIN) 1 g in sodium chloride 0.9 % 100 mL IVPB        1 g 200 mL/hr over 30 Minutes Intravenous  Once 01/25/23 2031 01/25/23 2121   01/25/23 2045  vancomycin (VANCOREADY) IVPB 1750 mg/350 mL        1,750 mg 175 mL/hr over 120 Minutes Intravenous  Once 01/25/23 2044 01/25/23 2332       Data Reviewed: I have personally reviewed following labs and imaging studies CBC: Recent Labs  Lab 01/25/23 1640 01/26/23 0518 01/27/23 0403  WBC 8.8 7.0 9.3  NEUTROABS 5.6  --   --   HGB 10.9* 10.1* 10.1*  HCT 35.0* 31.8* 31.8*  MCV 92.8 92.2 91.1  PLT 164 132* 146*   Basic Metabolic Panel: Recent Labs  Lab 01/25/23 1640 01/26/23 0518 01/27/23 0403  NA 139 134* 136  K 3.1* 3.0* 3.4*  CL 105 103 105  CO2 18* 21* 21*  GLUCOSE 121* 111* 181*  BUN 13 15 8   CREATININE 1.05 0.86 0.88  CALCIUM 8.8* 8.1* 8.0*   GFR: Estimated Creatinine Clearance: 69.7 mL/min (by C-G formula based on SCr of 0.88 mg/dL). Liver Function Tests: No results for input(s): "AST", "ALT", "ALKPHOS", "BILITOT", "PROT", "ALBUMIN" in the last 168 hours. CBG: Recent Labs  Lab 01/27/23 0102 01/27/23 0520 01/27/23 0802 01/27/23 1234 01/27/23 1617  GLUCAP 216* 165* 134* 120* 136*    Recent Results (from the past 240 hours)  Blood culture (routine x 2)     Status: None (Preliminary result)   Collection Time: 01/25/23  8:40 PM   Specimen: BLOOD  Result Value Ref Range Status   Specimen Description   Final    BLOOD RIGHT ANTECUBITAL Performed at [facility], [address] [place], [place] [address]    Special Requests   Final    BOTTLES DRAWN AEROBIC AND ANAEROBIC Blood Culture results may not be optimal due to an inadequate volume of blood received in culture bottles Performed at [facility] [address],  [place], [place] [address]    Culture   Final    NO GROWTH 2 DAYS Performed at [facility], [address], [place], [place] [address]    Report Status PENDING  Incomplete  Blood culture (routine x 2)     Status: None (Preliminary result)   Collection Time: 01/25/23  8:45 PM   Specimen: BLOOD  Result Value Ref Range Status   Specimen Description   Final    BLOOD LEFT ANTECUBITAL Performed at [facility], [address] [place], [place] [address]    Special Requests   Final    BOTTLES DRAWN AEROBIC AND ANAEROBIC Blood Culture results may not  be optimal due to an inadequate volume of blood received in culture bottles Performed at [facility], [address], [place], [place] [address]    Culture   Final    NO GROWTH 2 DAYS Performed at [facility], [address] [place], [place] [address]    Report Status PENDING  Incomplete  MRSA Next Gen by PCR, Nasal     Status: Abnormal   Collection Time: 01/25/23  8:51 PM   Specimen: Peripheral; Nasal Swab  Result Value Ref Range Status   MRSA by PCR Next Gen DETECTED (A) NOT DETECTED Final    Comment: (NOTE) The GeneXpert MRSA Assay (FDA approved for NASAL specimens only), is one component of a comprehensive MRSA colonization surveillance program. It is not intended to diagnose MRSA infection nor to guide or monitor treatment for MRSA infections. Test performance is not FDA approved in patients less than 59 years old. Performed at [facility], [address] [place], [place] [address]      Radiology Studies: CT Maxillofacial W Contrast Result Date: 01/25/2023 CLINICAL DATA:  Initial evaluation for nasal abscess. EXAM: CT MAXILLOFACIAL WITH CONTRAST TECHNIQUE: Multidetector CT imaging of the maxillofacial structures was performed with intravenous contrast. Multiplanar CT image reconstructions were also generated. RADIATION DOSE REDUCTION: This exam was performed  according to the departmental dose-optimization program which includes automated exposure control, adjustment of the mA and/or kV according to patient size and/or use of iterative reconstruction technique. CONTRAST:  80mL OMNIPAQUE IOHEXOL 300 MG/ML  SOLN COMPARISON:  None Available. FINDINGS: Osseous: No acute osseous abnormality. No discrete or worrisome osseous lesions. Patient is edentulous. Orbits: Globes and orbital soft tissues within normal limits. No evidence for intraorbital or postseptal cellulitis. Sinuses: Paranasal sinuses are clear. Visualized mastoid air cells and middle ear cavities are well pneumatized and free of fluid. Soft tissues: Diffuse soft tissue swelling seen about the inferior aspect of the nasal vestibule, extending inferiorly to involve the central and right aspect of the upper lip, concerning for localized infection/cellulitis. Subtle superimposed hypodense region at this location measuring 11 x 7 x 9 mm, likely reflecting phlegmon and/or early abscess formation (series 3, image 31). Unclear whether this reflects a frank drainable fluid collection at this time. No other loculated fluid collections or abscess. No other acute inflammatory changes seen about the visualized face. Salivary glands including the parotid and submandibular glands are within normal limits. Moderate to advanced atheromatous change about the visualized carotid bifurcations, worse on the left where there is at least moderate stenosis (series 3, image 11). Limited intracranial: Age-related cerebral atrophy with chronic microvascular ischemic disease. Otherwise unremarkable. IMPRESSION: 1. Diffuse soft tissue swelling about the inferior aspect of the nasal vestibule, extending inferiorly to involve the central and right aspect of the upper lip, concerning for localized infection/cellulitis. Subtle superimposed hypodense region at this location measuring 11 x 7 x 9 mm, likely reflecting phlegmon and/or early abscess  formation. Unclear whether this reflects a frank drainable fluid collection at this time. 2. No other acute abnormality within the face. 3. Moderate to advanced atheromatous change about the visualized carotid bifurcations, worse on the left where there is at least moderate stenosis. Electronically Signed   By: Rise Mu M.D.   On: 01/25/2023 19:54    Scheduled Meds:  ALPRAZolam  0.5 mg Oral QHS   atorvastatin  40 mg Oral QHS   busPIRone  10 mg Oral TID   donepezil  10 mg Oral QHS  enoxaparin (LOVENOX) injection  40 mg Subcutaneous Q24H   FLUoxetine  20 mg Oral Daily   insulin aspart  0-9 Units Subcutaneous Q4H   insulin glargine-yfgn  17 Units Subcutaneous QHS   lidocaine  1 patch Transdermal Daily   lidocaine  15 mL Mouth/Throat Q4H while awake   lisinopril  10 mg Oral Daily   mometasone-formoterol  2 puff Inhalation BID   pregabalin  50 mg Oral TID   traZODone  200 mg Oral QHS   umeclidinium bromide  1 puff Inhalation Daily   Continuous Infusions:  ampicillin-sulbactam (UNASYN) IV 3 g (01/27/23 1622)   vancomycin 1,000 mg (01/27/23 1114)     LOS: 1 day    Time spent:   Debarah Crape, DO [facility]  To contact the attending physician between 7A-7P please use Epic Chat. To contact the covering physician during after hours 7P-7A, please review Amion.   01/27/2023, 4:59 PM   *This document has been created with the assistance of dictation software. Please excuse typographical errors. *

## 2023-01-27 NOTE — Progress Notes (Signed)
Over the course of night shift, pt's bp decreased to 72/30(42), low 80's - 90's on RA, other vitals WNL. Pt also complained of SOB and lightheadedness. Pt was placed on 2L, O2 came up to 100%. Pt not oriented to place and situation. Pt had to be aroused multiple times to stay awake. Dr. Truitt Merle. Fluids started. Bp still remained unstable so rapid response called @0057 . Rapid response RN came to assess. No new orders placed. Pt later reassessed, observed sleeping, bp 110/56, 99% on 2L, other vitals WNL.

## 2023-01-27 NOTE — Progress Notes (Signed)
Patient ID: Andrew Ward, male   DOB: 11-Sep-1945, 77 y.o.   MRN: [id number]  He feels like the swelling and pain are better  It is less indurated and smaller.   He does not want surgery today and wants to continue antibiotics. Will check again tomorrow.

## 2023-01-27 NOTE — Inpatient Diabetes Management (Signed)
Inpatient Diabetes Program Recommendations  AACE/ADA: New Consensus Statement on Inpatient Glycemic Control  Target Ranges:  Prepandial:   less than 140 mg/dL      Peak postprandial:   less than 180 mg/dL (1-2 hours)      Critically ill patients:  140 - 180 mg/dL    Latest Reference Range & Units 01/27/23 00:15 01/27/23 01:02 01/27/23 05:20 01/27/23 08:02 01/27/23 12:34  Glucose-Capillary 70 - 99 mg/dL 573 (H) 220 (H) 254 (H) 134 (H) 120 (H)    Latest Reference Range & Units 01/26/23 00:16 01/26/23 03:41 01/26/23 08:33 01/26/23 12:59 01/26/23 14:44 01/26/23 20:31  Glucose-Capillary 70 - 99 mg/dL 270 (H)   Semglee 17 units @00 :52  111 (H) 88 130 (H)  Novolog 1 unit 72 117 (H)   Review of Glycemic Control  Diabetes history: DM2 Outpatient Diabetes medications: Tresiba 17 units at bedtime, Metformin 1000 mg BID Current orders for Inpatient glycemic control: Semglee 17 units at bedtime, Novolog 0-9 units Q4H  Inpatient Diabetes Program Recommendations:    Insulin: Please consider decreasing Semglee to 7 units at bedtime.  Thanks, Orlando Penner, RN, MSN, CDCES Diabetes Coordinator Inpatient Diabetes Program [phone] (Team Pager from 8am to 5pm)

## 2023-01-27 NOTE — Plan of Care (Signed)
  Problem: Education: Goal: Ability to describe self-care measures that may prevent or decrease complications (Diabetes Survival Skills Education) will improve Outcome: Progressing Goal: Individualized Educational Video(s) Outcome: Progressing   Problem: Coping: Goal: Ability to adjust to condition or change in health will improve Outcome: Progressing   

## 2023-01-28 DIAGNOSIS — K130 Diseases of lips: Secondary | ICD-10-CM | POA: Diagnosis not present

## 2023-01-28 LAB — CBC WITH DIFFERENTIAL/PLATELET
Abs Immature Granulocytes: 0.01 10*3/uL (ref 0.00–0.07)
Basophils Absolute: 0 10*3/uL (ref 0.0–0.1)
Basophils Relative: 1 %
Eosinophils Absolute: 0.4 10*3/uL (ref 0.0–0.5)
Eosinophils Relative: 10 %
HCT: 32.8 % — ABNORMAL LOW (ref 39.0–52.0)
Hemoglobin: 10.6 g/dL — ABNORMAL LOW (ref 13.0–17.0)
Immature Granulocytes: 0 %
Lymphocytes Relative: 24 %
Lymphs Abs: 1 10*3/uL (ref 0.7–4.0)
MCH: 29 pg (ref 26.0–34.0)
MCHC: 32.3 g/dL (ref 30.0–36.0)
MCV: 89.9 fL (ref 80.0–100.0)
Monocytes Absolute: 0.3 10*3/uL (ref 0.1–1.0)
Monocytes Relative: 7 %
Neutro Abs: 2.4 10*3/uL (ref 1.7–7.7)
Neutrophils Relative %: 58 %
Platelets: 146 10*3/uL — ABNORMAL LOW (ref 150–400)
RBC: 3.65 MIL/uL — ABNORMAL LOW (ref 4.22–5.81)
RDW: 14.3 % (ref 11.5–15.5)
WBC: 4 10*3/uL (ref 4.0–10.5)
nRBC: 0 % (ref 0.0–0.2)

## 2023-01-28 LAB — GLUCOSE, CAPILLARY
Glucose-Capillary: 134 mg/dL — ABNORMAL HIGH (ref 70–99)
Glucose-Capillary: 135 mg/dL — ABNORMAL HIGH (ref 70–99)
Glucose-Capillary: 146 mg/dL — ABNORMAL HIGH (ref 70–99)
Glucose-Capillary: 146 mg/dL — ABNORMAL HIGH (ref 70–99)
Glucose-Capillary: 217 mg/dL — ABNORMAL HIGH (ref 70–99)
Glucose-Capillary: 235 mg/dL — ABNORMAL HIGH (ref 70–99)
Glucose-Capillary: 264 mg/dL — ABNORMAL HIGH (ref 70–99)

## 2023-01-28 MED ORDER — VANCOMYCIN HCL 750 MG/150ML IV SOLN
750.0000 mg | Freq: Two times a day (BID) | INTRAVENOUS | Status: DC
  Administered 2023-01-28: 750 mg via INTRAVENOUS
  Filled 2023-01-28 (×2): qty 150

## 2023-01-28 MED ORDER — CLINDAMYCIN HCL 150 MG PO CAPS: 450.0000 mg | ORAL_CAPSULE | Freq: Three times a day (TID) | ORAL | Status: AC

## 2023-01-28 MED ORDER — CLINDAMYCIN HCL 150 MG PO CAPS: 450.0000 mg | ORAL_CAPSULE | Freq: Three times a day (TID) | ORAL | 0 refills | Status: DC

## 2023-01-28 MED ORDER — MUPIROCIN 2 % EX OINT: 1.0000 | TOPICAL_OINTMENT | Freq: Two times a day (BID) | CUTANEOUS | Status: DC

## 2023-01-28 MED ORDER — CHLORHEXIDINE GLUCONATE CLOTH 2 % EX PADS: 6.0000 | MEDICATED_PAD | Freq: Every day | CUTANEOUS | Status: DC

## 2023-01-28 NOTE — Hospital Course (Signed)
This patient is a 77 year old male with type 2 diabetes, hypertension, dementia, lung cancer on maintenance keytruda, who presented to [facility] ED complaining of upper lip swelling and surrounding erythema.  He was found to have an upper lip abscess.  ENT was consulted who requested admission to [facility] for I&D.  Patient was started on IV antibiotics including vancomycin for MRSA positive PCR.  He subsequently had significant improvement with antibiotics alone and did not require I&D.  On reevaluation on 12/13 patient was back to his baseline and had adequate pain control with minimal needs.  We will continue with a 10-day course of clindamycin and discharge back to his long-term living facility today.

## 2023-01-28 NOTE — [place] FL2 (Signed)
[place] MEDICAID FL2 LEVEL OF CARE FORM     IDENTIFICATION  Patient Name: Andrew Ward Birthdate: 09-30-1945 Sex: male Admission Date (Current Location): 01/25/2023  [facility] and [place] Number:  Producer, television/film/video and Address:  [facility], [address], [place], [place] [address]      Provider Number: [id number]  Attending Physician Name and Address:  Debarah Crape, DO  Relative Name and Phone Number:  Synthia Innocent; Stepdaughter; [phone]    Current Level of Care: Hospital Recommended Level of Care: Skilled Nursing Facility Prior Approval Number:    Date Approved/Denied:   PASRR Number: [id number]  Discharge Plan: SNF    Current Diagnoses: Patient Active Problem List   Diagnosis Date Noted   Abscess of lip 01/25/2023   Pancytopenia due to antineoplastic chemotherapy (HCC) 05/21/2020   Adenocarcinoma of lung (HCC) 05/21/2020   Type 2 diabetes mellitus with hyperglycemia, with long-term current use of insulin (HCC) 05/21/2020   Mixed diabetic hyperlipidemia associated with type 2 diabetes mellitus (HCC) 05/21/2020   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) 05/21/2020   Hypomagnesemia 05/21/2020   Multiple pulmonary nodules determined by computed tomography of lung 04/21/2018   Lactic acidosis 10/27/2016   Dementia without behavioral disturbance (HCC) 10/27/2016   Diabetes mellitus without complication (HCC) 10/27/2016   COPD (chronic obstructive pulmonary disease) (HCC) 10/27/2016   Essential hypertension 10/27/2016   Bipolar disorder (HCC) 10/27/2016    Orientation RESPIRATION BLADDER Height & Weight     Self  Normal (Room Air) Continent Weight: 151 lb 14.4 oz (68.9 kg) Height:  6' (182.9 cm)  BEHAVIORAL SYMPTOMS/MOOD NEUROLOGICAL BOWEL NUTRITION STATUS      Continent Diet (please see dc summary)  AMBULATORY STATUS COMMUNICATION OF NEEDS Skin   Limited Assist Verbally Normal                        Personal Care Assistance Level of Assistance  Bathing, Feeding, Dressing Bathing Assistance: Limited assistance Feeding assistance: Limited assistance Dressing Assistance: Limited assistance     Functional Limitations Info             SPECIAL CARE FACTORS FREQUENCY  PT (By licensed PT), OT (By licensed OT)     PT Frequency: 5x OT Frequency: 5x            Contractures Contractures Info: Not present    Additional Factors Info  Code Status, Isolation Precautions Code Status Info: Full Code       Isolation Precautions Info: Contact Precautions (MRSA)     Current Medications (01/28/2023):  This is the current hospital active medication list Current Facility-Administered Medications  Medication Dose Route Frequency Provider Last Rate Last Admin   acetaminophen (TYLENOL) tablet 650 mg  650 mg Oral Q6H PRN Hillary Bow, DO   650 mg at 01/26/23 5176   Or   acetaminophen (TYLENOL) suppository 650 mg  650 mg Rectal Q6H PRN Hillary Bow, DO       albuterol (PROVENTIL) (2.5 MG/3ML) 0.083% nebulizer solution 3 mL  3 mL Inhalation Q4H PRN Regalado, Belkys A, MD       ALPRAZolam Prudy Feeler) tablet 0.5 mg  0.5 mg Oral QHS Lyda Perone M, DO   0.5 mg at 01/27/23 2324   Ampicillin-Sulbactam (UNASYN) 3 g in sodium chloride 0.9 % 100 mL IVPB  3 g Intravenous Q6H Jamse Mead, RPH 200 mL/hr at 01/28/23 0953 3 g at 01/28/23 [phone]  atorvastatin (LIPITOR) tablet 40 mg  40 mg Oral QHS Lyda Perone M, DO   40 mg at 01/27/23 2324   busPIRone (BUSPAR) tablet 10 mg  10 mg Oral TID Hillary Bow, DO   10 mg at 01/28/23 1016   [START ON 01/29/2023] Chlorhexidine Gluconate Cloth 2 % PADS 6 each  6 each Topical Q0600 Dezii, Alexandra, DO       donepezil (ARICEPT) tablet 10 mg  10 mg Oral QHS Lyda Perone M, DO   10 mg at 01/27/23 2324   enoxaparin (LOVENOX) injection 40 mg  40 mg Subcutaneous Q24H Lyda Perone M, DO   40 mg at 01/28/23 0956   FLUoxetine (PROZAC) capsule 20 mg   20 mg Oral Daily Lyda Perone M, DO   20 mg at 01/28/23 1151   hydrALAZINE (APRESOLINE) injection 10 mg  10 mg Intravenous Q6H PRN Regalado, Belkys A, MD       HYDROcodone-acetaminophen (NORCO/VICODIN) 5-325 MG per tablet 2 tablet  2 tablet Oral Q6H PRN Dezii, Gordy Councilman, DO   2 tablet at 01/28/23 1349   insulin aspart (novoLOG) injection 0-9 Units  0-9 Units Subcutaneous Q4H Hillary Bow, DO   1 Units at 01/28/23 1152   insulin glargine-yfgn (SEMGLEE) injection 17 Units  17 Units Subcutaneous QHS Regalado, Belkys A, MD       lidocaine (LIDODERM) 5 % 1 patch  1 patch Transdermal Daily Lyda Perone M, DO   1 patch at 01/28/23 0956   lidocaine (XYLOCAINE) 2 % viscous mouth solution 15 mL  15 mL Mouth/Throat Q4H while awake Hillary Bow, DO   15 mL at 01/27/23 0855   lisinopril (ZESTRIL) tablet 10 mg  10 mg Oral Daily Lyda Perone M, DO   10 mg at 01/28/23 1014   mometasone-formoterol (DULERA) 200-5 MCG/ACT inhaler 2 puff  2 puff Inhalation BID Hillary Bow, DO   2 puff at 01/26/23 2000   morphine (PF) 2 MG/ML injection 2 mg  2 mg Intravenous Q4H PRN Dezii, Alexandra, DO   2 mg at 01/27/23 1625   mupirocin ointment (BACTROBAN) 2 % 1 Application  1 Application Nasal BID Dezii, Alexandra, DO       ondansetron (ZOFRAN) tablet 4 mg  4 mg Oral Q6H PRN Hillary Bow, DO       Or   ondansetron [facility]) injection 4 mg  4 mg Intravenous Q6H PRN Hillary Bow, DO       pregabalin (LYRICA) capsule 50 mg  50 mg Oral TID Hillary Bow, DO   50 mg at 01/28/23 1016   traZODone (DESYREL) tablet 200 mg  200 mg Oral QHS Lyda Perone M, DO   200 mg at 01/27/23 2324   umeclidinium bromide (INCRUSE ELLIPTA) 62.5 MCG/ACT 1 puff  1 puff Inhalation Daily Lyda Perone M, DO       vancomycin (VANCOREADY) IVPB 750 mg/150 mL  750 mg Intravenous Q12H Rexford Maus, RPH 150 mL/hr at 01/28/23 0953 750 mg at 01/28/23 1610     Discharge Medications: Please see discharge summary for a list of  discharge medications.  Relevant Imaging Results:  Relevant Lab Results:   Additional Information SS# [id number]  Marliss Coots, LCSW

## 2023-01-28 NOTE — TOC Transition Note (Addendum)
Transition of Care [facility]) - Discharge Note   Patient Details  Name: Andrew Ward MRN: [id number] Date of Birth: 1945/07/24  Transition of Care [facility]) CM/SW Contact:  Marliss Coots, LCSW Phone Number: 01/28/2023, 3:28 PM   Clinical Narrative:     Patient will DC to: [facility] Anticipated DC date: 01/28/2023 Family notified: Synthia Innocent; Stepdaughter; [phone] Transport by: Sharin Mons   Per MD patient ready for DC to [facility]. RN to call report prior to discharge [phone]). RN, patient, patient's family, and facility notified of DC. Discharge Summary and FL2 sent to facility. DC packet on chart. Ambulance transport requested for patient.   CSW will sign off for now as social work intervention is no longer needed. Please consult [place] again if new needs arise.    Final next level of care: Skilled Nursing Facility Barriers to Discharge: Barriers Resolved   Patient Goals and CMS Choice Patient states their goals for this hospitalization and ongoing recovery are:: SNF CMS Medicare.gov Compare Post Acute Care list provided to:: Patient Represenative (must comment) Choice offered to / list presented to : Adult Children      Discharge Placement              Patient chooses bed at: [facility] Patient to be transferred to facility by: [facility] Name of family member notified: Synthia Innocent; Stepdaughter; [phone] Patient and family notified of of transfer: 01/28/23  Discharge Plan and Services Additional resources added to the After Visit Summary for   In-house Referral: Clinical Social Work   Post Acute Care Choice: Skilled Nursing Facility                               Social Drivers of Health (SDOH) Interventions SDOH Screenings   Tobacco Use: Medium Risk (07/27/2022)   Received from [facility]     Readmission Risk Interventions    05/23/2020    1:34 PM  Readmission Risk Prevention Plan  Transportation Screening Complete  PCP or  Specialist Appt within 3-5 Days Complete  HRI or Home Care Consult Complete  Social Work Consult for Recovery Care Planning/Counseling Complete  Palliative Care Screening Not Applicable  Medication Review Oceanographer) Complete

## 2023-01-28 NOTE — Progress Notes (Signed)
Report called to [place] at [facility].

## 2023-01-28 NOTE — Progress Notes (Signed)
Pharmacy Antibiotic Note  Andrew Ward is a 77 y.o. male admitted on 01/25/2023 with cellulitis and abscess of face. Pharmacy has been consulted for Vancomycin and Unasyn dosing.  Updated repeated weight is 68.9 kg (previously reported at 79 kg) - vancomycin dose updated to reflect this change to prevent supratherapeutic AUC.   Plan: Change vancomycin to 750 mg IV q12h (eAUC 486, Scr 0.88, TBW)  Continue Unasyn 3g IV q6h Monitor renal function, cultures, clinical course Vancomycin levels at steady state, as indicated F/u ENT plans  Height: 6' (182.9 cm) Weight: 68.9 kg (151 lb 14.4 oz) IBW/kg (Calculated) : 77.6  Temp (24hrs), Avg:97.9 F (36.6 C), Min:97.7 F (36.5 C), Max:98 F (36.7 C)  Recent Labs  Lab 01/25/23 1640 01/26/23 0518 01/27/23 0403  WBC 8.8 7.0 9.3  CREATININE 1.05 0.86 0.88    Estimated Creatinine Clearance: 69.6 mL/min (by C-G formula based on SCr of 0.88 mg/dL).    Allergies  Allergen Reactions   Fentanyl Other (See Comments)    Pt stated it made him feel "weird"   Atorvastatin Other (See Comments)     Myalgias and leg pain   Duloxetine Other (See Comments)    "Not sure what happened" maybe chest got tight    Antimicrobials this admission: 12/10 Ceftriaxone x 1 12/10 Vancomycin >> 12/10 Unasyn >>  Dose adjustments this admission: 12/11 change vancomycin to 1000 mg IV q12h 12/13 change vancomycin to 750mg  IV q12h to reflect weights  Microbiology results: 12/10 BCx: NGTD 12/10 MRSA PCR: detected  Thank you for allowing pharmacy to be a part of this patient's care.  Rexford Maus, PharmD, BCPS 01/28/2023 7:35 AM

## 2023-01-28 NOTE — Plan of Care (Signed)

## 2023-01-28 NOTE — Progress Notes (Signed)
   01/28/23 1228  Mobility  Activity Refused mobility  Mobility Specialist Start Time (ACUTE ONLY) 1228  Mobility Specialist Stop Time (ACUTE ONLY) 1230  Mobility Specialist Time Calculation (min) (ACUTE ONLY) 2 min   Mobility Specialist: Progress Note  Pt refused mobility session d/t stating " I can't walk," pt denied despite MS encouragement. - Received and left in bed with all needs met. Call bell within reach. MS will follow up as time permits.   Barnie Mort, BS Mobility Specialist Please contact via SecureChat or Rehab office at [phone].

## 2023-01-28 NOTE — Progress Notes (Signed)
   01/28/23 1440  Mobility  Activity Refused mobility  Mobility Specialist Start Time (ACUTE ONLY) 1440  Mobility Specialist Stop Time (ACUTE ONLY) 1442  Mobility Specialist Time Calculation (min) (ACUTE ONLY) 2 min   Mobility Specialist: Progress Note  Pt refused mobility session d/t not wanting to sit in chair despite MS encouragement. - Received and left in bed with all needs met. Call bell within reach. MS will follow up as time permits.   Barnie Mort, BS Mobility Specialist Please contact via SecureChat or Rehab office at [phone].

## 2023-01-28 NOTE — TOC CM/SW Note (Deleted)
Transition of Care [facility]) - Inpatient Brief Assessment   Patient Details  Name: Andrew Ward MRN: [id number] Date of Birth: [birth year]  Transition of [facility]) CM/SW Contact:    Marliss Coots, LCSW Phone Number: 01/28/2023, 12:49 PM   Clinical Narrative:  12:49 PM Patient is currently not full oriented. CSW attempted to call patient's stepdaughter, Synthia Innocent, to confirm return to [facility] upon discharge. There was no response and a voicemail could not be left. CSW will continue to follow.  2:00 PM CSW called Fleet Contras again who answered CSW call. Fleet Contras consented to patient returning to [facility] upon discharge. Fleet Contras inquired about patient medical status, which CSW followed up with medical team on. CSW contacted admissions with [facility] to inform them of patient's return and follow up on next bed availability.  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

## 2023-01-28 NOTE — TOC Initial Note (Signed)
Transition of Care [facility]) - Initial/Assessment Note    Patient Details  Name: Andrew Ward MRN: [id number] Date of Birth: 1945/03/28  Transition of Care [facility]) CM/SW Contact:    Marliss Coots, LCSW Phone Number: 01/28/2023, 2:53 PM  Clinical Narrative:                  12:49 PM Patient is currently not full oriented. CSW attempted to call patient's stepdaughter, Synthia Innocent, to confirm return to [facility] upon discharge. There was no response and a voicemail could not be left. CSW will continue to follow.  2:00 PM CSW called Fleet Contras again who answered CSW call. Fleet Contras consented to patient returning to [facility] upon discharge. Fleet Contras inquired about patient medical status, which CSW followed up with medical team on. CSW contacted admissions with [facility] to inform them of patient's return and follow up on next bed availability.  2:53 PM CSW received confirmation from [facility] that he can discharge today without therapy evaluation. CSW relayed this to Raymond. CSW inquired about transportation. Arline Asp stated that she has not been able to get in touch with patient's son and is currently out of state. CSW informed Arline Asp of ambulance transportation (possible copy that is unknown until patient receives bill). Cindy expressed understanding of this and consented to ambulance transportation for discharge.  Expected Discharge Plan: Skilled Nursing Facility Barriers to Discharge: SNF Pending transportation, SNF Pending discharge summary, SNF Pending discharge orders   Patient Goals and CMS Choice Patient states their goals for this hospitalization and ongoing recovery are:: SNF CMS Medicare.gov Compare Post Acute Care list provided to:: Patient Represenative (must comment) Choice offered to / list presented to : Adult Children      Expected Discharge Plan and Services In-house Referral: Clinical Social Work   Post Acute Care Choice: Skilled Nursing Facility Living  arrangements for the past 2 months: Skilled Nursing Facility                                      Prior Living Arrangements/Services Living arrangements for the past 2 months: Skilled Nursing Facility Lives with:: Facility Resident Patient language and need for interpreter reviewed:: Yes        Need for Family Participation in Patient Care: Yes (Comment) Care giver support system in place?: Yes (comment)   Criminal Activity/Legal Involvement Pertinent to Current Situation/Hospitalization: No - Comment as needed  Activities of Daily Living      Permission Sought/Granted Permission sought to share information with : Family Supports, Oceanographer granted to share information with : Yes, Verbal Permission Granted  Share Information with NAME: Synthia Innocent  Permission granted to share info w AGENCY: [facility]  Permission granted to share info w Relationship: Stepdaughter  Permission granted to share info w Contact Information: [phone]  Emotional Assessment   Attitude/Demeanor/Rapport: Unable to Assess Affect (typically observed): Unable to Assess Orientation: : Oriented to Self Alcohol / Substance Use: Not Applicable Psych Involvement: No (comment)  Admission diagnosis:  Abscess of lip [K13.0] Abscess [L02.91] Patient Active Problem List   Diagnosis Date Noted   Abscess of lip 01/25/2023   Pancytopenia due to antineoplastic chemotherapy (HCC) 05/21/2020   Adenocarcinoma of lung (HCC) 05/21/2020   Type 2 diabetes mellitus with hyperglycemia, with long-term current use of insulin (HCC) 05/21/2020   Mixed diabetic hyperlipidemia associated with type 2 diabetes mellitus (HCC) 05/21/2020  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) 05/21/2020   Hypomagnesemia 05/21/2020   Multiple pulmonary nodules determined by computed tomography of lung 04/21/2018   Lactic acidosis 10/27/2016   Dementia without behavioral  disturbance (HCC) 10/27/2016   Diabetes mellitus without complication (HCC) 10/27/2016   COPD (chronic obstructive pulmonary disease) (HCC) 10/27/2016   Essential hypertension 10/27/2016   Bipolar disorder (HCC) 10/27/2016   PCP:  Irena Reichmann, DO Pharmacy:   [facility] - [place], [place] - [address] [place] [address] Phone: [phone] Fax: [phone]     Social Drivers of Health (SDOH) Social History: SDOH Screenings   Tobacco Use: Medium Risk (07/27/2022)   Received from [facility]   SDOH Interventions:     Readmission Risk Interventions    05/23/2020    1:34 PM  Readmission Risk Prevention Plan  Transportation Screening Complete  PCP or Specialist Appt within 3-5 Days Complete  HRI or Home Care Consult Complete  Social Work Consult for Recovery Care Planning/Counseling Complete  Palliative Care Screening Not Applicable  Medication Review Oceanographer) Complete

## 2023-01-28 NOTE — Discharge Summary (Signed)
Physician Discharge Summary   Patient: Andrew Ward MRN: [id number] DOB: 1945-10-10  Admit date:     01/25/2023  Discharge date: 01/28/23  Discharge Physician: Andrew Ward   PCP: Andrew Reichmann, DO   Recommendations at discharge:    Follow up with PCP to ensure abscess resolution in 2 weeks.  Discharge Diagnoses: Principal Problem:   Abscess of lip Active Problems:   Dementia without behavioral disturbance (HCC)   Diabetes mellitus without complication (HCC)   Adenocarcinoma of lung (HCC)  Resolved Problems:   * No resolved hospital problems. [facility] Course: This patient is a 77 year old male with type 2 diabetes, hypertension, dementia, lung cancer on maintenance keytruda, who presented to [facility] ED complaining of upper lip swelling and surrounding erythema.  He was found to have an upper lip abscess.  ENT was consulted who requested admission to [facility] for I&D.  Patient was started on IV antibiotics including vancomycin for MRSA positive PCR.  He subsequently had significant improvement with antibiotics alone and did not require I&D.  On reevaluation on 12/13 patient was back to his baseline and had adequate pain control with minimal needs.  We will continue with a 10-day course of clindamycin and discharge back to his long-term living facility today.  Care plan was discussed with his stepdaughter Andrew Ward on the phone.  She endorses understanding and agrees with plan.       Consultants: ENT Procedures performed: none  Disposition: Long term care facility Diet recommendation:  Carb modified diet DISCHARGE MEDICATION: Allergies as of 01/28/2023       Reactions   Fentanyl Other (See Comments)   Pt stated it made him feel "weird"   Atorvastatin Other (See Comments)    Myalgias and leg pain   Duloxetine Other (See Comments)   "Not sure what happened" maybe chest got tight        Medication List     STOP taking these medications    atorvastatin 40  MG tablet Commonly known as: LIPITOR       TAKE these medications    acetaminophen 500 MG tablet Commonly known as: TYLENOL Take 1,000 mg by mouth in the morning, at noon, and at bedtime.   Advair Diskus 250-50 MCG/DOSE Aepb Generic drug: fluticasone-salmeterol Inhale 1 puff into the lungs 2 (two) times daily.   albuterol 108 (90 Base) MCG/ACT inhaler Commonly known as: VENTOLIN HFA Inhale 2 puffs into the lungs 2 (two) times daily.   ALPRAZolam 0.5 MG tablet Commonly known as: XANAX Take 0.5 mg by mouth See admin instructions. Take 0.5 mg by mouth at bedtime and an additional 0.5 mg once a day as needed for increased agitation   antiseptic oral rinse Liqd 10 mLs by Mouth Rinse route in the morning and at bedtime.   busPIRone 10 MG tablet Commonly known as: BUSPAR Take 10 mg by mouth 3 (three) times daily.   clindamycin 150 MG capsule Commonly known as: Cleocin Take 3 capsules (450 mg total) by mouth 3 (three) times daily for 10 days.   Delsym 30 MG/5ML liquid Generic drug: dextromethorphan Take 60 mg by mouth every 12 (twelve) hours as needed for cough.   donepezil 10 MG tablet Commonly known as: ARICEPT Take 10 mg by mouth at bedtime.   FLUoxetine 20 MG capsule Commonly known as: PROZAC Take 20 mg by mouth daily.   Glucerna Liqd Take 120 mLs by mouth 2 (two) times daily between meals.   Incruse Ellipta 62.5 MCG/ACT Aepb  Generic drug: umeclidinium bromide Inhale 1 puff into the lungs daily.   lidocaine 2 % solution Commonly known as: XYLOCAINE Use as directed 15 mLs in the mouth or throat See admin instructions. Swish and spit out 15 ml's every 4 hours, while awake. Remove dentures to ensure the lidocaine gets under the tongue.   lidocaine 4 % Apply 1 patch topically See admin instructions. Apply 1 patch to the back at 9 AM and remove at 9 PM   lisinopril 10 MG tablet Commonly known as: ZESTRIL Take 10 mg by mouth daily.   metFORMIN 1000 MG  tablet Commonly known as: GLUCOPHAGE Take 1,000 mg by mouth 2 (two) times daily.   pregabalin 50 MG capsule Commonly known as: LYRICA Take 50 mg by mouth 3 (three) times daily.   traZODone 100 MG tablet Commonly known as: DESYREL Take 200 mg by mouth at bedtime.   Evaristo Bury FlexTouch 100 UNIT/ML FlexTouch Pen Generic drug: insulin degludec Inject 17 Units into the skin at bedtime.   Vitamin D3 125 MCG (5000 UT) Tabs Take 5,000 Units by mouth daily.        Discharge Exam: Filed Weights   01/27/23 0500 01/27/23 1800 01/28/23 0500  Weight: 69 kg 67.2 kg 68.9 kg   General exam: Appears calm and comfortable, NAD, thin and appears chronically ill Respiratory system: No work of breathing, symmetric chest wall expansion Cardiovascular system: S1 & S2 heard, RRR.  Gastrointestinal system: Abdomen is nondistended, soft and nontender.  Neuro: Oriented to self, time.  Disoriented to place and situation. Extremities: Symmetric, expected ROM Skin: Upper lip with crusting abscess, mildly tender to palpation, some surrounding induration though has improved from prior exams  psychiatry: Demonstrates appropriate judgement and insight. Mood & affect appropriate for situation.   Condition at discharge: stable  The results of significant diagnostics from this hospitalization (including imaging, microbiology, ancillary and laboratory) are listed below for reference.   Imaging Studies: CT Maxillofacial W Contrast Result Date: 01/25/2023 CLINICAL DATA:  Initial evaluation for nasal abscess. EXAM: CT MAXILLOFACIAL WITH CONTRAST TECHNIQUE: Multidetector CT imaging of the maxillofacial structures was performed with intravenous contrast. Multiplanar CT image reconstructions were also generated. RADIATION DOSE REDUCTION: This exam was performed according to the departmental dose-optimization program which includes automated exposure control, adjustment of the mA and/or kV according to patient size  and/or use of iterative reconstruction technique. CONTRAST:  80mL OMNIPAQUE IOHEXOL 300 MG/ML  SOLN COMPARISON:  None Available. FINDINGS: Osseous: No acute osseous abnormality. No discrete or worrisome osseous lesions. Patient is edentulous. Orbits: Globes and orbital soft tissues within normal limits. No evidence for intraorbital or postseptal cellulitis. Sinuses: Paranasal sinuses are clear. Visualized mastoid air cells and middle ear cavities are well pneumatized and free of fluid. Soft tissues: Diffuse soft tissue swelling seen about the inferior aspect of the nasal vestibule, extending inferiorly to involve the central and right aspect of the upper lip, concerning for localized infection/cellulitis. Subtle superimposed hypodense region at this location measuring 11 x 7 x 9 mm, likely reflecting phlegmon and/or early abscess formation (series 3, image 31). Unclear whether this reflects a frank drainable fluid collection at this time. No other loculated fluid collections or abscess. No other acute inflammatory changes seen about the visualized face. Salivary glands including the parotid and submandibular glands are within normal limits. Moderate to advanced atheromatous change about the visualized carotid bifurcations, worse on the left where there is at least moderate stenosis (series 3, image 11). Limited intracranial: Age-related cerebral atrophy  with chronic microvascular ischemic disease. Otherwise unremarkable. IMPRESSION: 1. Diffuse soft tissue swelling about the inferior aspect of the nasal vestibule, extending inferiorly to involve the central and right aspect of the upper lip, concerning for localized infection/cellulitis. Subtle superimposed hypodense region at this location measuring 11 x 7 x 9 mm, likely reflecting phlegmon and/or early abscess formation. Unclear whether this reflects a frank drainable fluid collection at this time. 2. No other acute abnormality within the face. 3. Moderate to  advanced atheromatous change about the visualized carotid bifurcations, worse on the left where there is at least moderate stenosis. Electronically Signed   By: Rise Mu M.D.   On: 01/25/2023 19:54    Microbiology: Results for orders placed or performed during the hospital encounter of 01/25/23  Blood culture (routine x 2)     Status: None (Preliminary result)   Collection Time: 01/25/23  8:40 PM   Specimen: BLOOD  Result Value Ref Range Status   Specimen Description   Final    BLOOD RIGHT ANTECUBITAL Performed at [facility], [address], [place], [place] [address]    Special Requests   Final    BOTTLES DRAWN AEROBIC AND ANAEROBIC Blood Culture results may not be optimal due to an inadequate volume of blood received in culture bottles Performed at [facility], [address], [place], [place] [address]    Culture   Final    NO GROWTH 3 DAYS Performed at [facility], [address] [place], [place] [address]    Report Status PENDING  Incomplete  Blood culture (routine x 2)     Status: None (Preliminary result)   Collection Time: 01/25/23  8:45 PM   Specimen: BLOOD  Result Value Ref Range Status   Specimen Description   Final    BLOOD LEFT ANTECUBITAL Performed at [facility], [address] [place], [place] [address]    Special Requests   Final    BOTTLES DRAWN AEROBIC AND ANAEROBIC Blood Culture results may not be optimal due to an inadequate volume of blood received in culture bottles Performed at [facility], [address] [place], [place] [address]    Culture   Final    NO GROWTH 3 DAYS Performed at [facility], [address] [place], [place] [address]    Report Status PENDING  Incomplete  MRSA Next Gen by PCR, Nasal     Status: Abnormal   Collection Time: 01/25/23  8:51 PM   Specimen: Peripheral; Nasal Swab  Result Value Ref Range Status   MRSA by PCR Next  Gen DETECTED (A) NOT DETECTED Final    Comment: (NOTE) The GeneXpert MRSA Assay (FDA approved for NASAL specimens only), is one component of a comprehensive MRSA colonization surveillance program. It is not intended to diagnose MRSA infection nor to guide or monitor treatment for MRSA infections. Test performance is not FDA approved in patients less than 33 years old. Performed at [facility], [address] [place], [place] [address]     Labs: CBC: Recent Labs  Lab 01/25/23 1640 01/26/23 0518 01/27/23 0403 01/28/23 0958  WBC 8.8 7.0 9.3 4.0  NEUTROABS 5.6  --   --  2.4  HGB 10.9* 10.1* 10.1* 10.6*  HCT 35.0* 31.8* 31.8* 32.8*  MCV 92.8 92.2 91.1 89.9  PLT 164 132* 146* 146*   Basic Metabolic Panel: Recent Labs  Lab 01/25/23 1640 01/26/23 0518 01/27/23 0403  NA 139 134* 136  K 3.1* 3.0* 3.4*  CL 105 103 105  CO2 18* 21* 21*  GLUCOSE 121* 111* 181*  BUN 13 15 8   CREATININE 1.05 0.86 0.88  CALCIUM 8.8* 8.1* 8.0*   Liver Function Tests: No results for input(s): "AST", "ALT", "ALKPHOS", "BILITOT", "PROT", "ALBUMIN" in the last 168 hours. CBG: Recent Labs  Lab 01/28/23 0003 01/28/23 0438 01/28/23 0725 01/28/23 1013 01/28/23 1136  GLUCAP 217* 264* 146* 135* 146*    Discharge time spent: greater than 30 minutes.  Signed: Debarah Crape, DO [facility] 01/28/2023

## 2023-01-30 LAB — CULTURE, BLOOD (ROUTINE X 2)
Culture: NO GROWTH
Culture: NO GROWTH
